# Patient Record
Sex: Male | Born: 1935 | Race: Black or African American | Hispanic: No | Smoking: Never smoker
Health system: Southern US, Community
[De-identification: ages and names within clinical notes are randomized; demographics above are authoritative.]

## PROBLEM LIST (undated history)

## (undated) ENCOUNTER — Emergency Department: Discharge status: Absent without leave | Payer: No Typology Code available for payment source

## (undated) DIAGNOSIS — R531 Weakness: Secondary | ICD-10-CM

## (undated) DIAGNOSIS — R6510 Systemic inflammatory response syndrome (SIRS) of non-infectious origin without acute organ dysfunction: Secondary | ICD-10-CM

## (undated) DIAGNOSIS — R739 Hyperglycemia, unspecified: Secondary | ICD-10-CM

## (undated) DIAGNOSIS — E785 Hyperlipidemia, unspecified: Secondary | ICD-10-CM

## (undated) DIAGNOSIS — E871 Hypo-osmolality and hyponatremia: Secondary | ICD-10-CM

## (undated) DIAGNOSIS — I10 Essential (primary) hypertension: Secondary | ICD-10-CM

## (undated) DIAGNOSIS — F0390 Unspecified dementia without behavioral disturbance: Secondary | ICD-10-CM

## (undated) DIAGNOSIS — E860 Dehydration: Secondary | ICD-10-CM

## (undated) DIAGNOSIS — R627 Adult failure to thrive: Secondary | ICD-10-CM

## (undated) DIAGNOSIS — E538 Deficiency of other specified B group vitamins: Secondary | ICD-10-CM

## (undated) DIAGNOSIS — R569 Unspecified convulsions: Secondary | ICD-10-CM

## (undated) DIAGNOSIS — G9340 Encephalopathy, unspecified: Secondary | ICD-10-CM

## (undated) DIAGNOSIS — C61 Malignant neoplasm of prostate: Secondary | ICD-10-CM

## (undated) DIAGNOSIS — J189 Pneumonia, unspecified organism: Secondary | ICD-10-CM

## (undated) HISTORY — DX: Malignant neoplasm of prostate: C61

## (undated) HISTORY — DX: Unspecified dementia, unspecified severity, without behavioral disturbance, psychotic disturbance, mood disturbance, and anxiety: F03.90

## (undated) HISTORY — DX: Unspecified convulsions: R56.9

## (undated) HISTORY — DX: Dehydration: E86.0

## (undated) HISTORY — DX: Hypo-osmolality and hyponatremia: E87.1

## (undated) HISTORY — DX: Pneumonia, unspecified organism: J18.9

## (undated) HISTORY — DX: Deficiency of other specified B group vitamins: E53.8

## (undated) HISTORY — DX: Weakness: R53.1

## (undated) HISTORY — DX: Hyperlipidemia, unspecified: E78.5

## (undated) HISTORY — DX: Adult failure to thrive: R62.7

## (undated) HISTORY — DX: Systemic inflammatory response syndrome (sirs) of non-infectious origin without acute organ dysfunction: R65.10

## (undated) HISTORY — DX: Essential (primary) hypertension: I10

## (undated) HISTORY — DX: Hyperglycemia, unspecified: R73.9

## (undated) HISTORY — DX: Encephalopathy, unspecified: G93.40

---

## 2000-12-05 ENCOUNTER — Encounter: Payer: Self-pay | Admitting: Neurosurgery

## 2000-12-06 ENCOUNTER — Ambulatory Visit (HOSPITAL_COMMUNITY): Admission: RE | Admit: 2000-12-06 | Discharge: 2000-12-06 | Payer: Self-pay | Admitting: Neurosurgery

## 2000-12-12 ENCOUNTER — Encounter: Payer: Self-pay | Admitting: Neurosurgery

## 2000-12-12 ENCOUNTER — Inpatient Hospital Stay (HOSPITAL_COMMUNITY): Admission: RE | Admit: 2000-12-12 | Discharge: 2000-12-14 | Payer: Self-pay | Admitting: Neurosurgery

## 2003-10-10 ENCOUNTER — Other Ambulatory Visit: Payer: Self-pay

## 2004-09-27 ENCOUNTER — Ambulatory Visit: Payer: Self-pay

## 2006-02-19 ENCOUNTER — Ambulatory Visit: Payer: Self-pay | Admitting: Gastroenterology

## 2007-06-18 ENCOUNTER — Other Ambulatory Visit: Payer: Self-pay

## 2007-06-18 ENCOUNTER — Emergency Department: Payer: Self-pay | Admitting: Internal Medicine

## 2008-01-07 ENCOUNTER — Ambulatory Visit: Payer: Self-pay | Admitting: Family Medicine

## 2008-04-20 ENCOUNTER — Ambulatory Visit: Payer: Self-pay | Admitting: Urology

## 2008-09-28 ENCOUNTER — Other Ambulatory Visit: Payer: Self-pay | Admitting: Internal Medicine

## 2008-10-02 ENCOUNTER — Ambulatory Visit: Payer: Self-pay | Admitting: Internal Medicine

## 2009-01-27 ENCOUNTER — Ambulatory Visit: Payer: Self-pay | Admitting: Gastroenterology

## 2009-02-08 ENCOUNTER — Ambulatory Visit: Payer: Self-pay | Admitting: Gastroenterology

## 2009-03-16 ENCOUNTER — Ambulatory Visit: Payer: Self-pay | Admitting: Family Medicine

## 2009-06-29 ENCOUNTER — Ambulatory Visit: Payer: Self-pay | Admitting: Urology

## 2009-11-08 ENCOUNTER — Ambulatory Visit: Payer: Self-pay | Admitting: Internal Medicine

## 2009-11-23 ENCOUNTER — Ambulatory Visit: Payer: Self-pay | Admitting: Internal Medicine

## 2009-12-09 ENCOUNTER — Ambulatory Visit: Payer: Self-pay | Admitting: Internal Medicine

## 2010-04-07 ENCOUNTER — Emergency Department: Payer: Self-pay | Admitting: Emergency Medicine

## 2010-05-17 ENCOUNTER — Emergency Department: Payer: Self-pay | Admitting: Emergency Medicine

## 2010-12-13 ENCOUNTER — Emergency Department: Payer: Self-pay | Admitting: Emergency Medicine

## 2012-01-15 ENCOUNTER — Observation Stay: Payer: Self-pay | Admitting: Internal Medicine

## 2012-01-15 LAB — COMPREHENSIVE METABOLIC PANEL
Albumin: 3.8 g/dL (ref 3.4–5.0)
Anion Gap: 9 (ref 7–16)
BUN: 12 mg/dL (ref 7–18)
Bilirubin,Total: 0.7 mg/dL (ref 0.2–1.0)
Calcium, Total: 9.1 mg/dL (ref 8.5–10.1)
Chloride: 101 mmol/L (ref 98–107)
Creatinine: 0.98 mg/dL (ref 0.60–1.30)
EGFR (African American): >60
Glucose: 156 mg/dL — ABNORMAL HIGH (ref 65–99)
Potassium: 4.1 mmol/L (ref 3.5–5.1)
SGOT(AST): 26 U/L (ref 15–37)
Sodium: 135 mmol/L — ABNORMAL LOW (ref 136–145)
Total Protein: 8.9 g/dL — ABNORMAL HIGH (ref 6.4–8.2)

## 2012-01-15 LAB — URINALYSIS, COMPLETE
Bacteria: NONE SEEN
Bilirubin,UR: NEGATIVE
Glucose,UR: NEGATIVE mg/dL (ref 0–75)
Ketone: NEGATIVE
Leukocyte Esterase: NEGATIVE
Ph: 5 (ref 4.5–8.0)
RBC,UR: 1 /HPF (ref 0–5)
Specific Gravity: 1.018 (ref 1.003–1.030)
Squamous Epithelial: NONE SEEN
WBC UR: 1 /HPF (ref 0–5)

## 2012-01-15 LAB — TROPONIN I
Troponin-I: <0.02 ng/mL
Troponin-I: <0.02 ng/mL

## 2012-01-15 LAB — CBC
HCT: 37.1 % — ABNORMAL LOW (ref 40.0–52.0)
MCHC: 35.1 g/dL (ref 32.0–36.0)
Platelet: 234 10*3/uL (ref 150–440)
RBC: 3.87 10*6/uL — ABNORMAL LOW (ref 4.40–5.90)
RDW: 13.6 % (ref 11.5–14.5)
WBC: 10.7 10*3/uL — ABNORMAL HIGH (ref 3.8–10.6)

## 2012-01-16 LAB — CBC WITH DIFFERENTIAL/PLATELET
Eosinophil #: 0.2 10*3/uL (ref 0.0–0.7)
Eosinophil %: 2.3 %
HCT: 34.6 % — ABNORMAL LOW (ref 40.0–52.0)
HGB: 11.7 g/dL — ABNORMAL LOW (ref 13.0–18.0)
MCH: 32.4 pg (ref 26.0–34.0)
MCHC: 33.7 g/dL (ref 32.0–36.0)
MCV: 96 fL (ref 80–100)
Monocyte #: 1.3 x10 3/mm — ABNORMAL HIGH (ref 0.2–1.0)
Monocyte %: 14.7 %
Neutrophil %: 59.3 %
Platelet: 207 10*3/uL (ref 150–440)
RBC: 3.61 10*6/uL — ABNORMAL LOW (ref 4.40–5.90)
RDW: 13.5 % (ref 11.5–14.5)

## 2012-01-16 LAB — BASIC METABOLIC PANEL
Calcium, Total: 8.7 mg/dL (ref 8.5–10.1)
Chloride: 105 mmol/L (ref 98–107)
Co2: 25 mmol/L (ref 21–32)
Creatinine: 0.88 mg/dL (ref 0.60–1.30)
EGFR (African American): >60
Glucose: 152 mg/dL — ABNORMAL HIGH (ref 65–99)
Potassium: 3.6 mmol/L (ref 3.5–5.1)
Sodium: 138 mmol/L (ref 136–145)

## 2012-01-16 LAB — TROPONIN I
Troponin-I: <0.02 ng/mL
Troponin-I: <0.02 ng/mL

## 2012-01-16 LAB — CK TOTAL AND CKMB (NOT AT ARMC)
CK, Total: 39 U/L (ref 35–232)
CK, Total: 80 U/L (ref 35–232)
CK-MB: 0.7 ng/mL (ref 0.5–3.6)
CK-MB: 1.1 ng/mL (ref 0.5–3.6)

## 2012-03-08 LAB — URINALYSIS, COMPLETE
Bilirubin,UR: NEGATIVE
Leukocyte Esterase: NEGATIVE
Nitrite: NEGATIVE
Ph: 6 (ref 4.5–8.0)
RBC,UR: 2 /HPF (ref 0–5)
Squamous Epithelial: NONE SEEN
WBC UR: <1 /HPF (ref 0–5)

## 2012-03-08 LAB — CBC
HCT: 36.5 % — ABNORMAL LOW (ref 40.0–52.0)
HGB: 13 g/dL (ref 13.0–18.0)
MCHC: 35.7 g/dL (ref 32.0–36.0)
MCV: 95 fL (ref 80–100)
RBC: 3.82 10*6/uL — ABNORMAL LOW (ref 4.40–5.90)
WBC: 10.4 10*3/uL (ref 3.8–10.6)

## 2012-03-08 LAB — COMPREHENSIVE METABOLIC PANEL
Alkaline Phosphatase: 81 U/L (ref 50–136)
BUN: 10 mg/dL (ref 7–18)
Bilirubin,Total: 0.8 mg/dL (ref 0.2–1.0)
Chloride: 99 mmol/L (ref 98–107)
Co2: 24 mmol/L (ref 21–32)
Creatinine: 0.87 mg/dL (ref 0.60–1.30)
EGFR (African American): >60
EGFR (Non-African Amer.): >60
SGOT(AST): 26 U/L (ref 15–37)
SGPT (ALT): 18 U/L (ref 12–78)
Total Protein: 8.8 g/dL — ABNORMAL HIGH (ref 6.4–8.2)

## 2012-03-09 ENCOUNTER — Observation Stay: Payer: Self-pay | Admitting: Internal Medicine

## 2012-03-09 LAB — CBC WITH DIFFERENTIAL/PLATELET
Basophil #: 0.1 10*3/uL (ref 0.0–0.1)
HCT: 35.1 % — ABNORMAL LOW (ref 40.0–52.0)
Lymphocyte %: 28.1 %
MCH: 33.9 pg (ref 26.0–34.0)
Monocyte %: 12 %
Neutrophil %: 56.2 %
Platelet: 220 10*3/uL (ref 150–440)
RDW: 13.1 % (ref 11.5–14.5)
WBC: 8.4 10*3/uL (ref 3.8–10.6)

## 2012-03-09 LAB — BASIC METABOLIC PANEL
BUN: 10 mg/dL (ref 7–18)
Calcium, Total: 9.1 mg/dL (ref 8.5–10.1)
Chloride: 101 mmol/L (ref 98–107)
EGFR (African American): >60
EGFR (Non-African Amer.): >60
Glucose: 120 mg/dL — ABNORMAL HIGH (ref 65–99)
Potassium: 3.6 mmol/L (ref 3.5–5.1)
Sodium: 133 mmol/L — ABNORMAL LOW (ref 136–145)

## 2012-03-09 LAB — TSH: Thyroid Stimulating Horm: 2.75 u[IU]/mL

## 2012-03-10 LAB — IRON AND TIBC
Iron Bind.Cap.(Total): 165 ug/dL — ABNORMAL LOW (ref 250–450)
Iron: 55 ug/dL — ABNORMAL LOW (ref 65–175)
Unbound Iron-Bind.Cap.: 110 ug/dL

## 2012-03-10 LAB — CBC WITH DIFFERENTIAL/PLATELET
Basophil #: 0.1 10*3/uL (ref 0.0–0.1)
Basophil %: 1.1 %
Eosinophil %: 5.6 %
HCT: 31 % — ABNORMAL LOW (ref 40.0–52.0)
Lymphocyte #: 1.7 10*3/uL (ref 1.0–3.6)
Lymphocyte %: 29.6 %
MCH: 33 pg (ref 26.0–34.0)
MCV: 95 fL (ref 80–100)
Monocyte %: 10.8 %
Neutrophil #: 3 10*3/uL (ref 1.4–6.5)
RBC: 3.25 10*6/uL — ABNORMAL LOW (ref 4.40–5.90)
RDW: 13.4 % (ref 11.5–14.5)
WBC: 5.7 10*3/uL (ref 3.8–10.6)

## 2012-03-10 LAB — URINE CULTURE

## 2012-03-10 LAB — BASIC METABOLIC PANEL
Calcium, Total: 8.1 mg/dL — ABNORMAL LOW (ref 8.5–10.1)
Chloride: 107 mmol/L (ref 98–107)
EGFR (Non-African Amer.): >60
Glucose: 129 mg/dL — ABNORMAL HIGH (ref 65–99)
Osmolality: 275 (ref 275–301)
Potassium: 3.7 mmol/L (ref 3.5–5.1)

## 2012-03-10 LAB — HEMOGLOBIN A1C: Hemoglobin A1C: 6.6 % — ABNORMAL HIGH (ref 4.2–6.3)

## 2012-03-10 LAB — FERRITIN: Ferritin (ARMC): 272 ng/mL (ref 8–388)

## 2012-03-12 ENCOUNTER — Encounter: Payer: Self-pay | Admitting: Internal Medicine

## 2012-05-27 ENCOUNTER — Ambulatory Visit: Payer: Self-pay | Admitting: Family Medicine

## 2012-08-19 ENCOUNTER — Ambulatory Visit: Payer: Self-pay | Admitting: Family Medicine

## 2012-10-16 ENCOUNTER — Inpatient Hospital Stay: Payer: Self-pay | Admitting: Internal Medicine

## 2012-10-16 LAB — COMPREHENSIVE METABOLIC PANEL
Albumin: 3 g/dL — ABNORMAL LOW (ref 3.4–5.0)
Alkaline Phosphatase: 92 U/L (ref 50–136)
Anion Gap: 5 — ABNORMAL LOW (ref 7–16)
BUN: 12 mg/dL (ref 7–18)
Bilirubin,Total: 0.7 mg/dL (ref 0.2–1.0)
Calcium, Total: 8.8 mg/dL (ref 8.5–10.1)
Chloride: 99 mmol/L (ref 98–107)
Co2: 24 mmol/L (ref 21–32)
Creatinine: 1.04 mg/dL (ref 0.60–1.30)
EGFR (African American): >60
EGFR (Non-African Amer.): >60
Glucose: 195 mg/dL — ABNORMAL HIGH (ref 65–99)
Osmolality: 262 (ref 275–301)
Potassium: 3.9 mmol/L (ref 3.5–5.1)
SGOT(AST): 34 U/L (ref 15–37)
Sodium: 128 mmol/L — ABNORMAL LOW (ref 136–145)
Total Protein: 8 g/dL (ref 6.4–8.2)

## 2012-10-16 LAB — CBC
HCT: 38 % — ABNORMAL LOW (ref 40.0–52.0)
MCH: 32.4 pg (ref 26.0–34.0)
MCHC: 34.1 g/dL (ref 32.0–36.0)
MCV: 95 fL (ref 80–100)
Platelet: 240 10*3/uL (ref 150–440)
RBC: 4 10*6/uL — ABNORMAL LOW (ref 4.40–5.90)
RDW: 13.5 % (ref 11.5–14.5)

## 2012-10-16 LAB — URINALYSIS, COMPLETE
Glucose,UR: 50 mg/dL (ref 0–75)
Leukocyte Esterase: NEGATIVE
Nitrite: NEGATIVE
RBC,UR: <1 /HPF (ref 0–5)
Specific Gravity: 1.015 (ref 1.003–1.030)
WBC UR: <1 /HPF (ref 0–5)

## 2012-10-16 LAB — CK TOTAL AND CKMB (NOT AT ARMC): CK-MB: 0.5 ng/mL (ref 0.5–3.6)

## 2012-10-17 LAB — BASIC METABOLIC PANEL
Anion Gap: 5 — ABNORMAL LOW (ref 7–16)
Co2: 27 mmol/L (ref 21–32)
Creatinine: 1.16 mg/dL (ref 0.60–1.30)
EGFR (African American): >60
EGFR (Non-African Amer.): 56 — ABNORMAL LOW
Glucose: 139 mg/dL — ABNORMAL HIGH (ref 65–99)
Potassium: 3.8 mmol/L (ref 3.5–5.1)
Sodium: 136 mmol/L (ref 136–145)

## 2012-10-18 LAB — CBC WITH DIFFERENTIAL/PLATELET
Basophil #: 0.1 10*3/uL (ref 0.0–0.1)
Eosinophil #: 0.1 10*3/uL (ref 0.0–0.7)
Eosinophil %: 0.6 %
HCT: 35 % — ABNORMAL LOW (ref 40.0–52.0)
HGB: 12.2 g/dL — ABNORMAL LOW (ref 13.0–18.0)
Lymphocyte #: 1.5 10*3/uL (ref 1.0–3.6)
Lymphocyte %: 15.7 %
MCH: 32.8 pg (ref 26.0–34.0)
MCHC: 34.8 g/dL (ref 32.0–36.0)
MCV: 94 fL (ref 80–100)
Monocyte #: 1.3 x10 3/mm — ABNORMAL HIGH (ref 0.2–1.0)
Neutrophil #: 6.5 10*3/uL (ref 1.4–6.5)
Neutrophil %: 69.5 %
RBC: 3.71 10*6/uL — ABNORMAL LOW (ref 4.40–5.90)
RDW: 13.4 % (ref 11.5–14.5)

## 2012-10-22 LAB — PLATELET COUNT: Platelet: 285 10*3/uL (ref 150–440)

## 2012-10-23 ENCOUNTER — Observation Stay: Payer: Self-pay | Admitting: Internal Medicine

## 2012-10-23 LAB — TROPONIN I
Troponin-I: <0.02 ng/mL
Troponin-I: <0.02 ng/mL

## 2012-10-23 LAB — URINALYSIS, COMPLETE
Bacteria: NONE SEEN
Bilirubin,UR: NEGATIVE
Glucose,UR: NEGATIVE mg/dL (ref 0–75)
Leukocyte Esterase: NEGATIVE
Nitrite: NEGATIVE
Ph: 7 (ref 4.5–8.0)
Protein: 30
RBC,UR: NONE SEEN /HPF (ref 0–5)
WBC UR: <1 /HPF (ref 0–5)

## 2012-10-23 LAB — COMPREHENSIVE METABOLIC PANEL
Anion Gap: 6 — ABNORMAL LOW (ref 7–16)
Bilirubin,Total: 0.3 mg/dL (ref 0.2–1.0)
Calcium, Total: 8.8 mg/dL (ref 8.5–10.1)
Co2: 28 mmol/L (ref 21–32)
Creatinine: 1.21 mg/dL (ref 0.60–1.30)
Glucose: 136 mg/dL — ABNORMAL HIGH (ref 65–99)
SGPT (ALT): 124 U/L — ABNORMAL HIGH (ref 12–78)
Total Protein: 7.5 g/dL (ref 6.4–8.2)

## 2012-10-23 LAB — CK TOTAL AND CKMB (NOT AT ARMC)
CK, Total: 68 U/L (ref 35–232)
CK, Total: 63 U/L (ref 35–232)
CK-MB: 1.2 ng/mL (ref 0.5–3.6)

## 2012-10-23 LAB — CBC
MCHC: 34.4 g/dL (ref 32.0–36.0)
RBC: 3.9 10*6/uL — ABNORMAL LOW (ref 4.40–5.90)
RDW: 13.6 % (ref 11.5–14.5)

## 2012-10-24 DIAGNOSIS — I359 Nonrheumatic aortic valve disorder, unspecified: Secondary | ICD-10-CM

## 2012-10-24 LAB — TROPONIN I
Troponin-I: <0.02 ng/mL
Troponin-I: <0.02 ng/mL

## 2012-10-24 LAB — BASIC METABOLIC PANEL
Anion Gap: 8 (ref 7–16)
BUN: 11 mg/dL (ref 7–18)
Chloride: 104 mmol/L (ref 98–107)
Co2: 24 mmol/L (ref 21–32)
Creatinine: 1.02 mg/dL (ref 0.60–1.30)
EGFR (Non-African Amer.): >60
Glucose: 123 mg/dL — ABNORMAL HIGH (ref 65–99)
Sodium: 136 mmol/L (ref 136–145)

## 2012-10-24 LAB — CK TOTAL AND CKMB (NOT AT ARMC)
CK, Total: 97 U/L (ref 35–232)
CK-MB: 0.7 ng/mL (ref 0.5–3.6)

## 2012-10-26 ENCOUNTER — Encounter: Payer: Self-pay | Admitting: Internal Medicine

## 2012-11-08 ENCOUNTER — Encounter: Payer: Self-pay | Admitting: Internal Medicine

## 2012-11-12 ENCOUNTER — Ambulatory Visit: Payer: Self-pay | Admitting: Gerontology

## 2013-04-14 ENCOUNTER — Inpatient Hospital Stay: Payer: Self-pay | Admitting: Internal Medicine

## 2013-04-14 LAB — COMPREHENSIVE METABOLIC PANEL
Albumin: 3.1 g/dL — ABNORMAL LOW (ref 3.4–5.0)
Alkaline Phosphatase: 92 U/L
Anion Gap: 5 — ABNORMAL LOW (ref 7–16)
BILIRUBIN TOTAL: 1 mg/dL (ref 0.2–1.0)
BUN: 12 mg/dL (ref 7–18)
CALCIUM: 9.4 mg/dL (ref 8.5–10.1)
CHLORIDE: 97 mmol/L — AB (ref 98–107)
CO2: 28 mmol/L (ref 21–32)
CREATININE: 1.05 mg/dL (ref 0.60–1.30)
EGFR (African American): >60
Glucose: 161 mg/dL — ABNORMAL HIGH (ref 65–99)
OSMOLALITY: 264 (ref 275–301)
POTASSIUM: 4.2 mmol/L (ref 3.5–5.1)
SGOT(AST): 20 U/L (ref 15–37)
SGPT (ALT): 18 U/L (ref 12–78)
SODIUM: 130 mmol/L — AB (ref 136–145)
Total Protein: 9.1 g/dL — ABNORMAL HIGH (ref 6.4–8.2)

## 2013-04-14 LAB — CBC
HCT: 43.9 % (ref 40.0–52.0)
HGB: 14.7 g/dL (ref 13.0–18.0)
MCH: 31.8 pg (ref 26.0–34.0)
MCHC: 33.5 g/dL (ref 32.0–36.0)
MCV: 95 fL (ref 80–100)
Platelet: 314 10*3/uL (ref 150–440)
RBC: 4.63 10*6/uL (ref 4.40–5.90)
RDW: 13.4 % (ref 11.5–14.5)
WBC: 12.8 10*3/uL — AB (ref 3.8–10.6)

## 2013-04-14 LAB — AMMONIA: Ammonia, Plasma: <10 mcmol/L (ref 11–32)

## 2013-04-14 LAB — TROPONIN I: Troponin-I: <0.02 ng/mL

## 2013-04-14 LAB — TSH: Thyroid Stimulating Horm: 2.59 u[IU]/mL

## 2013-04-15 LAB — BASIC METABOLIC PANEL
ANION GAP: 6 — AB (ref 7–16)
BUN: 11 mg/dL (ref 7–18)
CO2: 23 mmol/L (ref 21–32)
CREATININE: 0.88 mg/dL (ref 0.60–1.30)
Calcium, Total: 8.9 mg/dL (ref 8.5–10.1)
Chloride: 101 mmol/L (ref 98–107)
EGFR (African American): >60
Glucose: 130 mg/dL — ABNORMAL HIGH (ref 65–99)
OSMOLALITY: 262 (ref 275–301)
POTASSIUM: 4.1 mmol/L (ref 3.5–5.1)
SODIUM: 130 mmol/L — AB (ref 136–145)

## 2013-04-15 LAB — CBC WITH DIFFERENTIAL/PLATELET
BASOS ABS: 0 10*3/uL (ref 0.0–0.1)
Basophil %: 0.5 %
EOS ABS: 0.2 10*3/uL (ref 0.0–0.7)
EOS PCT: 1.7 %
HCT: 39.1 % — AB (ref 40.0–52.0)
HGB: 13.2 g/dL (ref 13.0–18.0)
LYMPHS PCT: 14 %
Lymphocyte #: 1.3 10*3/uL (ref 1.0–3.6)
MCH: 32 pg (ref 26.0–34.0)
MCHC: 33.7 g/dL (ref 32.0–36.0)
MCV: 95 fL (ref 80–100)
Monocyte #: 0.9 x10 3/mm (ref 0.2–1.0)
Monocyte %: 10.2 %
NEUTROS ABS: 6.8 10*3/uL — AB (ref 1.4–6.5)
NEUTROS PCT: 73.6 %
PLATELETS: 290 10*3/uL (ref 150–440)
RBC: 4.13 10*6/uL — ABNORMAL LOW (ref 4.40–5.90)
RDW: 13.3 % (ref 11.5–14.5)
WBC: 9.2 10*3/uL (ref 3.8–10.6)

## 2013-04-17 LAB — BASIC METABOLIC PANEL
Anion Gap: 6 — ABNORMAL LOW (ref 7–16)
BUN: 9 mg/dL (ref 7–18)
Calcium, Total: 8.6 mg/dL (ref 8.5–10.1)
Chloride: 102 mmol/L (ref 98–107)
Co2: 26 mmol/L (ref 21–32)
Creatinine: 0.97 mg/dL (ref 0.60–1.30)
EGFR (African American): >60
GLUCOSE: 134 mg/dL — AB (ref 65–99)
OSMOLALITY: 269 (ref 275–301)
Potassium: 4.6 mmol/L (ref 3.5–5.1)
Sodium: 134 mmol/L — ABNORMAL LOW (ref 136–145)

## 2013-04-22 ENCOUNTER — Encounter: Payer: Self-pay | Admitting: Adult Health

## 2013-04-22 ENCOUNTER — Non-Acute Institutional Stay (SKILLED_NURSING_FACILITY): Payer: Medicare Other | Admitting: Adult Health

## 2013-04-22 DIAGNOSIS — G9340 Encephalopathy, unspecified: Secondary | ICD-10-CM

## 2013-04-22 DIAGNOSIS — I10 Essential (primary) hypertension: Secondary | ICD-10-CM

## 2013-04-22 DIAGNOSIS — R5381 Other malaise: Secondary | ICD-10-CM

## 2013-04-22 DIAGNOSIS — N400 Enlarged prostate without lower urinary tract symptoms: Secondary | ICD-10-CM

## 2013-04-22 DIAGNOSIS — R531 Weakness: Secondary | ICD-10-CM | POA: Insufficient documentation

## 2013-04-22 DIAGNOSIS — F0390 Unspecified dementia without behavioral disturbance: Secondary | ICD-10-CM

## 2013-04-22 DIAGNOSIS — R5383 Other fatigue: Secondary | ICD-10-CM

## 2013-04-22 NOTE — Progress Notes (Signed)
Patient ID: [name], male   DOB: [birth year], 80 y.o.   MRN: [id number]     [facility]  Allergies  Allergen Reactions  . Penicillins      Chief Complaint  Patient presents with  . Hospitalization Follow-up    HPI:  He had recently had pneumonia went home; then had to return back to the hospital for increased weakness and acute encephalopathy. He is here for short term rehab. He lives in his own home; but has 24 hour care in his home environment.     Past Medical History  Diagnosis Date  . Hyperlipidemia   . Hypertension   . Seizures   . Prostate cancer   . Acute encephalopathy   . Dementia   . SIRS (systemic inflammatory response syndrome)   . Pneumonia   . Weakness generalized   . Dehydration   . B12 deficiency   . Hyponatremia   . Hyperglycemia   . FTT (failure to thrive) in adult     No past surgical history on file.  VITAL SIGNS BP 143/70  Pulse 80  Ht 5\' 8"  (1.727 m)  Wt 157 lb (71.215 kg)  BMI 23.88 kg/m2   Patient's Medications  New Prescriptions   No medications on file  Previous Medications   ASPIRIN 81 MG TABLET    Take 81 mg by mouth daily.   FINASTERIDE (PROSCAR) 5 MG TABLET    Take 5 mg by mouth daily.   ISOSORBIDE MONONITRATE (IMDUR) 60 MG 24 HR TABLET    Take 60 mg by mouth daily.   MEMANTINE HCL ER (NAMENDA XR) 28 MG CP24    Take 28 mg by mouth daily.  Modified Medications   No medications on file  Discontinued Medications   No medications on file    SIGNIFICANT DIAGNOSTIC EXAMS  04-14-13: chest x-ray: significantly; rotated chronic changes. No clear infiltrate. Elevated left hemidiaphragm cardiomegaly  04-14-13: ekg: ST with occasional pac's  04-14-13: ct of head: atrophy and chronic ischemic white matter disease. Small right lacunar infarct. No acute intracranial abnormality.   04-14-13: right hip x-ray: no acute osseous injury of pelvis.      LABS REVIEWED:  04-14-13: wbc 12.8; hgb 14.7; plt 314; glucose 161; bun 12; creat  1.05; k+4.2; na++130; liver normal    Review of Systems  Constitutional: Negative for malaise/fatigue.  Respiratory: Negative for cough and shortness of breath.   Cardiovascular: Negative for chest pain and palpitations.  Gastrointestinal: Negative for heartburn, abdominal pain and constipation.  Musculoskeletal: Negative for back pain, joint pain and myalgias.  Skin: Negative.   Neurological: Negative for headaches.  Psychiatric/Behavioral: Negative for depression. The patient is not nervous/anxious.     Physical Exam  Constitutional: He appears well-developed and well-nourished. No distress.  Neck: Neck supple. No JVD present.  Cardiovascular: Normal rate, regular rhythm and intact distal pulses.   Respiratory: Effort normal and breath sounds normal. No respiratory distress.  GI: Soft. Bowel sounds are normal. He exhibits no distension. There is no tenderness.  Musculoskeletal: Normal range of motion. He exhibits no edema.  Neurological: He is alert.  Skin: Skin is warm and dry. He is not diaphoretic.  Psychiatric: He has a normal mood and affect.      ASSESSMENT/ PLAN:  1. Hypertension; he is stable will continue imdur 60 mg daily and asa 81 mg daily   2. bph with history of prostate cancer: will continue proscar 5 mg daily   3. Dementia without behavioral issues:  is presently with change in status; will continue namenda xr 28 mg dialy  4. Acute encephalopathy and generalized; is stable will continue therapy as directed and will continue to monitor his status.   Will check cbc and bmp in one week   Time spent with patient 50 minutes.

## 2013-04-24 ENCOUNTER — Non-Acute Institutional Stay (SKILLED_NURSING_FACILITY): Payer: Medicare Other | Admitting: Internal Medicine

## 2013-04-24 ENCOUNTER — Encounter: Payer: Self-pay | Admitting: Internal Medicine

## 2013-04-24 DIAGNOSIS — N400 Enlarged prostate without lower urinary tract symptoms: Secondary | ICD-10-CM

## 2013-04-24 DIAGNOSIS — R531 Weakness: Secondary | ICD-10-CM

## 2013-04-24 DIAGNOSIS — R5383 Other fatigue: Secondary | ICD-10-CM

## 2013-04-24 DIAGNOSIS — I10 Essential (primary) hypertension: Secondary | ICD-10-CM

## 2013-04-24 DIAGNOSIS — F0390 Unspecified dementia without behavioral disturbance: Secondary | ICD-10-CM

## 2013-04-24 DIAGNOSIS — R5381 Other malaise: Secondary | ICD-10-CM

## 2013-04-24 NOTE — Progress Notes (Signed)
Patient ID: [name], male   DOB: [birth year], 80 y.o.   MRN: [id number]    [facility]    PCP: No primary provider on file.  Code Status:   Allergies  Allergen Reactions  . Penicillins     Chief Complaint: new admit  HPI:  80 y/o male patient is here for STR after hospital admission with pneumonia and weakness. He also had confusion and acute encephalopathy in setting of this. Goal is to strngthen him and return home where he has 24 hour care provided. He is seen in his room sitting on his wheelchair. He is in no distress and denies any complaints. His oral intake is low as per staff. He has dementia at baseline. No other complaints. He had a mechanical fall 2 days back. No injury reported  Review of Systems  Constitutional: Negative for fever, chills, weight loss, malaise/fatigue and diaphoresis.  HENT: Negative for congestion, hearing loss and sore throat.   Eyes: Negative for blurred vision, double vision and discharge.  Respiratory: Negative for cough, sputum production, shortness of breath and wheezing.   Cardiovascular: Negative for chest pain, palpitations, orthopnea and leg swelling.  Gastrointestinal: Negative for heartburn, nausea, vomiting, abdominal pain, diarrhea and constipation.  Genitourinary: Negative for dysuria, urgency, frequency and flank pain.  Musculoskeletal: Negative for back pain, falls, joint pain and myalgias.  Skin: Negative for itching and rash.  Neurological: Negative for dizziness, tingling, focal weakness and headaches.  Psychiatric/Behavioral: Negative for depression and memory loss. The patient is not nervous/anxious.     Past Medical History  Diagnosis Date  . Hyperlipidemia   . Hypertension   . Seizures   . Prostate cancer   . Acute encephalopathy   . Dementia   . SIRS (systemic inflammatory response syndrome)   . Pneumonia   . Weakness generalized   . Dehydration   . B12 deficiency   . Hyponatremia   . Hyperglycemia   .  FTT (failure to thrive) in adult    History reviewed. No pertinent past surgical history. Social History:   has no tobacco, alcohol, and drug history on file.  No family history on file.  Medications: Patient's Medications  New Prescriptions   No medications on file  Previous Medications   ASPIRIN 81 MG TABLET    Take 81 mg by mouth daily.   FINASTERIDE (PROSCAR) 5 MG TABLET    Take 5 mg by mouth daily.   ISOSORBIDE MONONITRATE (IMDUR) 60 MG 24 HR TABLET    Take 60 mg by mouth daily.   MEMANTINE HCL ER (NAMENDA XR) 28 MG CP24    Take 28 mg by mouth daily.  Modified Medications   No medications on file  Discontinued Medications   No medications on file     Physical Exam: Filed Vitals:   04/24/13 1754  BP: 140/70  Pulse: 79  Temp: 97.7 F (36.5 C)  Resp: 18  SpO2: 96%   Constitutional: He appears well-developed and well-nourished. No distress.  Neck: Neck supple. No JVD present.  Cardiovascular: Normal rate, regular rhythm and intact distal pulses.   Respiratory: Effort normal and breath sounds normal. No respiratory distress.  GI: Soft. Bowel sounds are normal. He exhibits no distension. There is no tenderness.  Musculoskeletal: Normal range of motion. He exhibits no edema.  Neurological: He is alert.  Skin: Skin is warm and dry. He is not diaphoretic.  Psychiatric: He has a normal mood and affect.    Labs reviewed:  04-14-13: wbc 12.8; hgb 14.7; plt 314; glucose 161; bun 12; creat 1.05; k+4.2; na++130; liver normal    Radiological Exams: 04-14-13: chest x-ray: significantly; rotated chronic changes. No clear infiltrate. Elevated left hemidiaphragm cardiomegaly  04-14-13: ekg: ST with occasional pac's  04-14-13: ct of head: atrophy and chronic ischemic white matter disease. Small right lacunar infarct. No acute intracranial abnormality.   04-14-13: right hip x-ray: no acute osseous injury of pelvis.     Assessment/Plan  Generalized weakness- to work with PT and OT,  gait training, muscle strengthening. Encourage po inake. Fall precautions. Has made improvement with therapy team  Hypertension- well controlled. continue imdur 60 mg daily and asa 81 mg daily   Dementia- with no acute behavior changes. Continue namenda xr 20 mg daily for now. Monitor clinically  BPH- continue proscar 5 mg daily for now  Family/ staff Communication: reviewed care plan with patient and nursing supervisor   Goals of care: to return home   Labs/tests ordered: none

## 2013-05-20 ENCOUNTER — Encounter: Payer: Self-pay | Admitting: Adult Health

## 2013-05-20 ENCOUNTER — Non-Acute Institutional Stay (SKILLED_NURSING_FACILITY): Payer: Medicare Other | Admitting: Adult Health

## 2013-05-20 DIAGNOSIS — M549 Dorsalgia, unspecified: Secondary | ICD-10-CM

## 2013-05-20 MED ORDER — ACETAMINOPHEN 500 MG PO TABS: 1000.0000 mg | ORAL_TABLET | Freq: Three times a day (TID) | ORAL | Status: DC

## 2013-05-20 NOTE — Progress Notes (Signed)
Patient ID: [name], male   DOB: 12/26/1920, 80 y.o.   MRN: [id number]    [facility]  Allergies  Allergen Reactions  . Penicillins     Chief Complaint  Patient presents with  . Acute Visit    back pain     HPI:  Staff reports that he is having back and has utilized his standing order for tylenol several times since admission. He tells me that his back hurts from time to time. And states that his pain is all over his back.   Past Medical History  Diagnosis Date  . Hyperlipidemia   . Hypertension   . Seizures   . Prostate cancer   . Acute encephalopathy   . Dementia   . SIRS (systemic inflammatory response syndrome)   . Pneumonia   . Weakness generalized   . Dehydration   . B12 deficiency   . Hyponatremia   . Hyperglycemia   . FTT (failure to thrive) in adult     No past surgical history on file.  VITAL SIGNS BP 100/54  Pulse 68  Ht 5\' 8"  (1.727 m)  Wt 158 lb 12.8 oz (72.031 kg)  BMI 24.15 kg/m2   Patient's Medications  New Prescriptions   No medications on file  Previous Medications   ASPIRIN 81 MG TABLET    Take 81 mg by mouth daily.   FINASTERIDE (PROSCAR) 5 MG TABLET    Take 5 mg by mouth daily.   ISOSORBIDE MONONITRATE (IMDUR) 60 MG 24 HR TABLET    Take 60 mg by mouth daily.   MEMANTINE HCL ER (NAMENDA XR) 28 MG CP24    Take 28 mg by mouth daily.  Modified Medications   No medications on file  Discontinued Medications   No medications on file    SIGNIFICANT DIAGNOSTIC EXAMS  04-14-13: chest x-ray: significantly; rotated chronic changes. No clear infiltrate. Elevated left hemidiaphragm cardiomegaly  04-14-13: ekg: ST with occasional pac's  04-14-13: ct of head: atrophy and chronic ischemic white matter disease. Small right lacunar infarct. No acute intracranial abnormality.   04-14-13: right hip x-ray: no acute osseous injury of pelvis.      LABS REVIEWED:  04-14-13: wbc 12.8; hgb 14.7; plt 314; glucose 161; bun 12; creat 1.05; k+4.2; na++130;  liver normal    Review of Systems  Constitutional: Negative for malaise/fatigue.  Respiratory: Negative for cough and shortness of breath.   Cardiovascular: Negative for chest pain and palpitations.  Gastrointestinal: Negative for heartburn, abdominal pain and constipation.  Musculoskeletal: Negative for joint pain has back pain  Skin: Negative.   Neurological: Negative for headaches.  Psychiatric/Behavioral: Negative for depression. The patient is not nervous/anxious.     Physical Exam  Constitutional: He appears well-developed and well-nourished. No distress.  Neck: Neck supple. No JVD present.  Cardiovascular: Normal rate, regular rhythm and intact distal pulses.   Respiratory: Effort normal and breath sounds normal. No respiratory distress.  GI: Soft. Bowel sounds are normal. He exhibits no distension. There is no tenderness.  Musculoskeletal: Normal range of motion. He exhibits no edema. no tenderness to back  Neurological: He is alert.  Skin: Skin is warm and dry. He is not diaphoretic.  Psychiatric: He has a normal mood and affect.     ASSESSMENT/ PLAN:  1. Back pain: will begin tylenol 1 gm three times daily and will monitor his status.

## 2013-05-23 ENCOUNTER — Non-Acute Institutional Stay (SKILLED_NURSING_FACILITY): Payer: Medicare Other | Admitting: Adult Health

## 2013-05-23 DIAGNOSIS — R531 Weakness: Secondary | ICD-10-CM

## 2013-05-23 DIAGNOSIS — M549 Dorsalgia, unspecified: Secondary | ICD-10-CM

## 2013-05-23 DIAGNOSIS — R5381 Other malaise: Secondary | ICD-10-CM

## 2013-05-23 DIAGNOSIS — R5383 Other fatigue: Secondary | ICD-10-CM

## 2013-05-23 DIAGNOSIS — I10 Essential (primary) hypertension: Secondary | ICD-10-CM

## 2013-05-23 DIAGNOSIS — F0390 Unspecified dementia without behavioral disturbance: Secondary | ICD-10-CM

## 2013-05-23 DIAGNOSIS — N400 Enlarged prostate without lower urinary tract symptoms: Secondary | ICD-10-CM

## 2013-05-26 ENCOUNTER — Encounter: Payer: Self-pay | Admitting: Adult Health

## 2013-05-26 NOTE — Progress Notes (Signed)
Patient ID: [name], male   DOB: [birth year], 80 y.o.   MRN: [id number]     [facility]  Allergies  Allergen Reactions  . Penicillins      Chief Complaint  Patient presents with  . Medical Managment of Chronic Issues    HPI:  He is being seen for the management of his chronic illnesses. He is not having back pain since starting on routine tylenol. There are no concerns being voiced by the nursing staff at this time. He is not voicing any complaints today.    Past Medical History  Diagnosis Date  . Hyperlipidemia   . Hypertension   . Seizures   . Prostate cancer   . Acute encephalopathy   . Dementia   . SIRS (systemic inflammatory response syndrome)   . Pneumonia   . Weakness generalized   . Dehydration   . B12 deficiency   . Hyponatremia   . Hyperglycemia   . FTT (failure to thrive) in adult     No past surgical history on file.  VITAL SIGNS BP 110/63  Pulse 70  Ht 5\' 8"  (1.727 m)  Wt 158 lb 12.8 oz (72.031 kg)  BMI 24.15 kg/m2   Patient's Medications  New Prescriptions   No medications on file  Previous Medications   ACETAMINOPHEN (TYLENOL) 500 MG TABLET    Take 2 tablets (1,000 mg total) by mouth 3 (three) times daily.   ASPIRIN 81 MG TABLET    Take 81 mg by mouth daily.   FINASTERIDE (PROSCAR) 5 MG TABLET    Take 5 mg by mouth daily.   ISOSORBIDE MONONITRATE (IMDUR) 60 MG 24 HR TABLET    Take 60 mg by mouth daily.   MEMANTINE HCL ER (NAMENDA XR) 28 MG CP24    Take 28 mg by mouth daily.  Modified Medications   No medications on file  Discontinued Medications   No medications on file    SIGNIFICANT DIAGNOSTIC EXAMS  04-14-13: chest x-ray: significantly; rotated chronic changes. No clear infiltrate. Elevated left hemidiaphragm cardiomegaly  04-14-13: ekg: ST with occasional pac's  04-14-13: ct of head: atrophy and chronic ischemic white matter disease. Small right lacunar infarct. No acute intracranial abnormality.   04-14-13: right hip x-ray: no  acute osseous injury of pelvis.      LABS REVIEWED:  04-14-13: wbc 12.8; hgb 14.7; plt 314; glucose 161; bun 12; creat 1.05; k+4.2; na++130; liver normal    Review of Systems  Constitutional: Negative for malaise/fatigue.  Respiratory: Negative for cough and shortness of breath.   Cardiovascular: Negative for chest pain and palpitations.  Gastrointestinal: Negative for heartburn, abdominal pain and constipation.  Musculoskeletal: Negative for joint pain no back pain  Skin: Negative.   Neurological: Negative for headaches.  Psychiatric/Behavioral: Negative for depression. The patient is not nervous/anxious.     Physical Exam  Constitutional: He appears well-developed and well-nourished. No distress.  Neck: Neck supple. No JVD present.  Cardiovascular: Normal rate, regular rhythm and intact distal pulses.   Respiratory: Effort normal and breath sounds normal. No respiratory distress.  GI: Soft. Bowel sounds are normal. He exhibits no distension. There is no tenderness.  Musculoskeletal: Normal range of motion. He exhibits no edema.  Neurological: He is alert.  Skin: Skin is warm and dry. He is not diaphoretic.  Psychiatric: He has a normal mood and affect.      ASSESSMENT/ PLAN:  1. Back pain: no complaints of pain present will continue tylenol 1 gm three  times daily   2. Dementia: without change in status: will continue namenda xr 28 mg daily   3. BPH: will continue proscar 5 mg daily   4. Hypertension: is stable will continue  imdur 60 mg daily and asa 81 mg daily and will monitor   5. Generalized weakness: he will continue therapy as directed; his goal continues to be short term rehab.     Ok Edwards NP [facility]  Contact [phone] Monday through Friday 8am- 5pm  After hours call [phone]

## 2013-06-04 ENCOUNTER — Non-Acute Institutional Stay (SKILLED_NURSING_FACILITY): Payer: Medicare Other | Admitting: Adult Health

## 2013-06-04 DIAGNOSIS — G9340 Encephalopathy, unspecified: Secondary | ICD-10-CM

## 2013-06-04 DIAGNOSIS — R5381 Other malaise: Secondary | ICD-10-CM

## 2013-06-04 DIAGNOSIS — R5383 Other fatigue: Secondary | ICD-10-CM

## 2013-06-04 DIAGNOSIS — I10 Essential (primary) hypertension: Secondary | ICD-10-CM

## 2013-06-04 DIAGNOSIS — F0390 Unspecified dementia without behavioral disturbance: Secondary | ICD-10-CM

## 2013-06-04 DIAGNOSIS — R531 Weakness: Secondary | ICD-10-CM

## 2013-06-10 ENCOUNTER — Encounter: Payer: Self-pay | Admitting: Adult Health

## 2013-06-10 NOTE — Progress Notes (Signed)
Patient ID: [name], male   DOB: 1921-04-03, 80 y.o.   MRN: [id number]     [facility]  Allergies  Allergen Reactions  . Penicillins      Chief Complaint  Patient presents with  . Discharge Note    HPI:   He is ready to be discharged to home with home health for pt/ot/nursing. He will not need dme. He will need prescriptions to be written. He had been admitted to this facility for short term rehab for weakness following a hospitalization for pneumonia.    Past Medical History  Diagnosis Date  . Hyperlipidemia   . Hypertension   . Seizures   . Prostate cancer   . Acute encephalopathy   . Dementia   . SIRS (systemic inflammatory response syndrome)   . Pneumonia   . Weakness generalized   . Dehydration   . B12 deficiency   . Hyponatremia   . Hyperglycemia   . FTT (failure to thrive) in adult     No past surgical history on file.  VITAL SIGNS BP 128/72  Pulse 79  Ht 5\' 8"  (1.727 m)  Wt 158 lb 12.8 oz (72.031 kg)  BMI 24.15 kg/m2   Patient's Medications  New Prescriptions   No medications on file  Previous Medications   ACETAMINOPHEN (TYLENOL) 500 MG TABLET    Take 2 tablets (1,000 mg total) by mouth 3 (three) times daily.   ASPIRIN 81 MG TABLET    Take 81 mg by mouth daily.   FINASTERIDE (PROSCAR) 5 MG TABLET    Take 5 mg by mouth daily.   ISOSORBIDE MONONITRATE (IMDUR) 60 MG 24 HR TABLET    Take 60 mg by mouth daily.   MEMANTINE HCL ER (NAMENDA XR) 28 MG CP24    Take 28 mg by mouth daily.  Modified Medications   No medications on file  Discontinued Medications   No medications on file    SIGNIFICANT DIAGNOSTIC EXAMS   04-14-13: chest x-ray: significantly; rotated chronic changes. No clear infiltrate. Elevated left hemidiaphragm cardiomegaly  04-14-13: ekg: ST with occasional pac's  04-14-13: ct of head: atrophy and chronic ischemic white matter disease. Small right lacunar infarct. No acute intracranial abnormality.   04-14-13: right hip x-ray: no  acute osseous injury of pelvis.      LABS REVIEWED:  04-14-13: wbc 12.8; hgb 14.7; plt 314; glucose 161; bun 12; creat 1.05; k+4.2; na++130; liver normal    Review of Systems  Constitutional: Negative for malaise/fatigue.  Respiratory: Negative for cough and shortness of breath.   Cardiovascular: Negative for chest pain and palpitations.  Gastrointestinal: Negative for heartburn, abdominal pain and constipation.  Musculoskeletal: Negative for joint pain no back pain  Skin: Negative.   Neurological: Negative for headaches.  Psychiatric/Behavioral: Negative for depression. The patient is not nervous/anxious.     Physical Exam  Constitutional: He appears well-developed and well-nourished. No distress.  Neck: Neck supple. No JVD present.  Cardiovascular: Normal rate, regular rhythm and intact distal pulses.   Respiratory: Effort normal and breath sounds normal. No respiratory distress.  GI: Soft. Bowel sounds are normal. He exhibits no distension. There is no tenderness.  Musculoskeletal: Normal range of motion. He exhibits no edema.  Neurological: He is alert.  Skin: Skin is warm and dry. He is not diaphoretic.  Psychiatric: He has a normal mood and affect.      ASSESSMENT/ PLAN:  Will discharge to home with home health for pt/ot/nursing. He will not need dme.  His prescriptions have been written.   Time spent with patient 40 minutes.      Ok Edwards NP [facility]  Contact [phone] Monday through Friday 8am- 5pm  After hours call [phone]

## 2014-05-26 ENCOUNTER — Inpatient Hospital Stay: Payer: Self-pay | Admitting: Internal Medicine

## 2014-05-26 LAB — BASIC METABOLIC PANEL
BUN: 16 mg/dL (ref 4–21)
Creatinine: 1.1 mg/dL (ref 0.6–1.3)
Glucose: 179 mg/dL
Potassium: 4.2 mmol/L (ref 3.4–5.3)
SODIUM: 133 mmol/L — AB (ref 137–147)

## 2014-05-26 LAB — CBC AND DIFFERENTIAL
Hemoglobin: 14.8 g/dL (ref 13.5–17.5)
Platelets: 225 10*3/uL (ref 150–399)
WBC: 14 10^3/mL

## 2014-06-01 ENCOUNTER — Non-Acute Institutional Stay (SKILLED_NURSING_FACILITY): Payer: Medicare Other | Admitting: Internal Medicine

## 2014-06-01 DIAGNOSIS — Z049 Encounter for examination and observation for unspecified reason: Secondary | ICD-10-CM | POA: Diagnosis not present

## 2014-06-01 DIAGNOSIS — F0390 Unspecified dementia without behavioral disturbance: Secondary | ICD-10-CM

## 2014-06-01 DIAGNOSIS — N400 Enlarged prostate without lower urinary tract symptoms: Secondary | ICD-10-CM | POA: Diagnosis not present

## 2014-06-01 DIAGNOSIS — R6889 Other general symptoms and signs: Secondary | ICD-10-CM

## 2014-06-01 DIAGNOSIS — R531 Weakness: Secondary | ICD-10-CM | POA: Diagnosis not present

## 2014-06-01 DIAGNOSIS — N3000 Acute cystitis without hematuria: Secondary | ICD-10-CM | POA: Diagnosis not present

## 2014-06-01 DIAGNOSIS — K5900 Constipation, unspecified: Secondary | ICD-10-CM

## 2014-06-01 DIAGNOSIS — I82401 Acute embolism and thrombosis of unspecified deep veins of right lower extremity: Secondary | ICD-10-CM | POA: Diagnosis not present

## 2014-06-01 DIAGNOSIS — I10 Essential (primary) hypertension: Secondary | ICD-10-CM | POA: Diagnosis not present

## 2014-06-30 ENCOUNTER — Encounter: Payer: Self-pay | Admitting: Registered Nurse

## 2014-06-30 ENCOUNTER — Non-Acute Institutional Stay (SKILLED_NURSING_FACILITY): Payer: Medicare Other | Admitting: Registered Nurse

## 2014-06-30 DIAGNOSIS — R634 Abnormal weight loss: Secondary | ICD-10-CM

## 2014-06-30 DIAGNOSIS — R531 Weakness: Secondary | ICD-10-CM

## 2014-06-30 DIAGNOSIS — E871 Hypo-osmolality and hyponatremia: Secondary | ICD-10-CM | POA: Diagnosis not present

## 2014-06-30 DIAGNOSIS — I82411 Acute embolism and thrombosis of right femoral vein: Secondary | ICD-10-CM

## 2014-06-30 DIAGNOSIS — L89620 Pressure ulcer of left heel, unstageable: Secondary | ICD-10-CM | POA: Diagnosis not present

## 2014-06-30 DIAGNOSIS — N400 Enlarged prostate without lower urinary tract symptoms: Secondary | ICD-10-CM

## 2014-06-30 DIAGNOSIS — F0390 Unspecified dementia without behavioral disturbance: Secondary | ICD-10-CM | POA: Diagnosis not present

## 2014-06-30 DIAGNOSIS — K5900 Constipation, unspecified: Secondary | ICD-10-CM | POA: Diagnosis not present

## 2014-06-30 DIAGNOSIS — I10 Essential (primary) hypertension: Secondary | ICD-10-CM | POA: Diagnosis not present

## 2014-06-30 NOTE — Progress Notes (Signed)
Patient ID: [name], male   DOB: May 16, 1920, 81 y.o.   MRN: [id number]   Place of Service: [facility]  Allergies  Allergen Reactions  . Penicillins     Code Status: Full Code  Goals of Care: Longevity/STR  Chief Complaint  Patient presents with  . Medical Management of Chronic Issues    Dementia, Unstageable PU, BPH, DVT, HTN    HPI 81 y.o. male with PMH of dementia, HTN, borderline DM, BPH, HLD, DVT of right deep femoral vein, sepsis from UTI among others is being seen for a routine visit for management of his chronic issues. Has ~7lbs weight loss over the past 30 days. Unstageable pressure ulcer of left heel is measuring larger than during his admission, but has been stable over the past week. No recent fall, change in behavior, or functional status reported. No concerns from staff. HTN fairly controlled with bp in 130s-140s/70s. BPH stable. No issues with bleeding with xarelto for DVT of right deep femoral vein. Seen in room today. Denies any concerns. Family at bedside.   Review of Systems Constitutional: Negative for fever and chills HENT: Negative for ear pain, congestion, and sore throat Eyes: Negative for eye pain and eye discharge Cardiovascular: Negative for chest pain Respiratory: Negative cough and shortness of breath Gastrointestinal: Negative for nausea and vomiting. Negative for abdominal pain Genitourinary: Negative for  dysuria Musculoskeletal: Negative for back pain and joint pain Neurological: Negative for dizziness and headache. Positive for weakness Skin: Negative for rash and itchiness Psychiatric: Negative for depression  Past Medical History  Diagnosis Date  . Hyperlipidemia   . Hypertension   . Seizures   . Prostate cancer   . Acute encephalopathy   . Dementia   . SIRS (systemic inflammatory response syndrome)   . Pneumonia   . Weakness generalized   . Dehydration   . B12 deficiency   . Hyponatremia   . Hyperglycemia   . FTT  (failure to thrive) in adult    Family Hx: DM2, HTN, CVA  History   Social History  . Marital Status: Married    Spouse Name: N/A  . Number of Children: N/A  . Years of Education: N/A   Social History Main Topics  . Smoking status: Never Smoker   . Smokeless tobacco: Not on file  . Alcohol Use: No  . Drug Use: No  . Sexual Activity: Not on file   Other Topics Concern  . None   Social History Narrative      Medication List       This list is accurate as of: 06/30/14  1:20 PM.  Always use your most recent med list.               acetaminophen 325 MG tablet  Commonly known as:  TYLENOL  Take 650 mg by mouth every 4 (four) hours as needed.     aspirin 81 MG tablet  Take 81 mg by mouth daily.     docusate sodium 100 MG capsule  Commonly known as:  COLACE  Take 100 mg by mouth 2 (two) times daily.     finasteride 5 MG tablet  Commonly known as:  PROSCAR  Take 5 mg by mouth daily.     furosemide 20 MG tablet  Commonly known as:  LASIX  Take 20 mg by mouth daily.     isosorbide mononitrate 60 MG 24 hr tablet  Commonly known as:  IMDUR  Take 60 mg by  mouth daily.     Memantine HCl ER 21 MG Cp24  Take 21 mg by mouth daily.     rivaroxaban 20 MG Tabs tablet  Commonly known as:  XARELTO  Take 20 mg by mouth daily with supper.     tamsulosin 0.4 MG Caps capsule  Commonly known as:  FLOMAX  Take 0.4 mg by mouth daily.        Physical Exam  BP 130/62 mmHg  Pulse 78  Temp(Src) 98 F (36.7 C)  Resp 18  Ht 5\' 8"  (1.727 m)  Wt 162 lb 12.8 oz (73.846 kg)  BMI 24.76 kg/m2  SpO2 97%  Constitutional: frail elderly male in no acute distress. Conversant and pleasant with some confusions HEENT: Normocephalic and atraumatic. PERRL. EOM intact. No sleral icterus. ENo nasal discharge or sinus tenderness. Oral mucosa moist. Posterior pharynx clear of any exudate or lesions.  Neck: No lymphadenopathy, masses, or thyromegaly. No JVD or carotid bruits. Cardiac:  Normal S1, S2. RRR without appreciable murmurs, rubs, or gallops. Distal pulses intact. Trace dependent edema.  Lungs: No respiratory distress. Breath sounds clear bilaterally without rales, rhonchi, or wheezes. Abdomen: Audible bowel sounds in all quadrants. Soft, nontender, nondistended.  Musculoskeletal: able to move all extremities with limited ROM throughout and generalized weakness. No joint erythema or tenderness. Skin: Warm and dry. No rash noted. Left heel unstageable pressure ulcer noted, measuring 5x4.5cm with 100% black eschar Neurological: Alert and oriented to self Psychiatric: Appropriate mood and affect for situation   Labs Reviewed  CBC Latest Ref Rng 05/26/2014  WBC - 14.0  Hemoglobin 13.5 - 17.5 g/dL 14.8  Platelets 150 - 399 K/L 225    CMP Latest Ref Rng 05/26/2014  BUN 4 - 21 mg/dL 16  Creatinine 0.6 - 1.3 mg/dL 1.1  Sodium 137 - 147 mmol/L 133(A)  Potassium 3.4 - 5.3 mmol/L 4.2    Assessment & Plan 1. Essential hypertension, benign Stable. Continue lasix 20mg  daily. Not on potassium supplement-will check lab and initiate treatment as indicated  2. Weakness generalized Stable. Continue to work with PT/OT for gait/strength/balance training to restore/maximize function. Fall risk precautions  3. Dementia without behavioral disturbance Stable. Further declined anticipated. Continue namenda xr 21mg  daily. Continue to monitor for change in behavior and assist with ADLs. Fall and pressure ulcer precautions.   4. BPH (benign prostatic hyperplasia) Stable. Continue proscar 5mg  daily and flomax 0.4mg  daily. Continue to monitor  5. Pressure ulcer of left heel, unstageable Stable over the past week. Continue skin prep to wound Qshift. Continue to elevate left heel via boot for pressure relief. Continue to monitor his status.   6. DVT of deep femoral vein, right Stable. Continue xarelto 20mg  daily. Continue to monitor for signs of potential bleeding.   7. Chronic  hyponatremia Last Na 133. No recent lab. Recheck cmp   8. Constipation, unspecified constipation type No issues. Continue colace 100mg  twice daily. Encourage hydration and activities as tolerated.   9. Weight Loss 7lbs over the past 30 days. Continue mechanical soft diet with thin liquids and medpass 172ml twice daily. Continue to monitor.    Diagnostic Studies/Labs Ordered: cbc w/ diff, cmp  Family/Staff Communication Plan of care discussed with family and nursing staff. Family and nursing staff verbalized understanding and agree with plan of care. No additional questions or concerns reported.    Arthur Holms, MSN, AGNP-C [facility] [address] [place], [place] [address] [phone] [8am-5pm] After hours: [phone]

## 2014-07-01 LAB — BASIC METABOLIC PANEL
BUN: 16 mg/dL (ref 4–21)
Creatinine: 0.8 mg/dL (ref 0.6–1.3)
GLUCOSE: 147 mg/dL
POTASSIUM: 4.3 mmol/L (ref 3.4–5.3)
Sodium: 133 mmol/L — AB (ref 137–147)

## 2014-07-01 LAB — CBC AND DIFFERENTIAL
Hemoglobin: 10.2 g/dL — AB (ref 13.5–17.5)
Platelets: 30 10*3/uL — AB (ref 150–399)
WBC: 8.1 10*3/mL

## 2014-07-03 NOTE — Progress Notes (Signed)
Patient ID: [name], male   DOB: [birth year], 81 y.o.   MRN: [id number]    Facility: [facility]   Chief Complaint  Patient presents with  . New Admit To SNF   Allergies  Allergen Reactions  . Penicillins    HPI 81 y/o male patient is here for rehabilitation post hospital admission from 05/26/14-05/28/14 with generalized weakness and difficulty with ambulation. He was diagnosed wth UTI and right deep femoral vein DVT. He was started on xarelto and antibiotics. He has pmh of HTN, borderline DM, hyponatremia, dementia, HLD, seizures and prostate cancer He is seen in his room today. He denies any concerns. He has generalized weakness.   ROS Constitutional: Negative for fever, chills and diaphoresis.  HENT: Negative for congestion.   Eyes: Negative for blurred vision  Respiratory: Negative for cough, shortness of breath and wheezing.   Cardiovascular: Negative for chest pain, palpitations and leg swelling.  Gastrointestinal: Negative for heartburn, nausea, vomiting, abdominal pain.  Genitourinary: Negative for dysuria Musculoskeletal: Negative for back pain, falls  Skin: Negative for itching and rash.  Neurological: Negative for dizziness Psychiatric/Behavioral: has dementia  Past Medical History  Diagnosis Date  . Hyperlipidemia   . Hypertension   . Seizures   . Prostate cancer   . Acute encephalopathy   . Dementia   . SIRS (systemic inflammatory response syndrome)   . Pneumonia   . Weakness generalized   . Dehydration   . B12 deficiency   . Hyponatremia   . Hyperglycemia   . FTT (failure to thrive) in adult    No past surgical history on file.  Medication reviewed. See MAR  No family history on file.   History   Social History  . Marital Status: Married    Spouse Name: N/A  . Number of Children: N/A  . Years of Education: N/A   Occupational History  . Not on file.   Social History Main Topics  . Smoking status: Never Smoker   .  Smokeless tobacco: Not on file  . Alcohol Use: No  . Drug Use: No  . Sexual Activity: Not on file   Other Topics Concern  . Not on file   Social History Narrative   Physical exam BP 128/64 mmHg  Pulse 89  Temp(Src) 98 F (36.7 C)  Resp 18  Constitutional: frail and in no distress.   Neck: Neck supple. No JVD present.   Cardiovascular: Normal rate, regular rhythm and intact distal pulses.    Respiratory: Effort normal and breath sounds normal. No respiratory distress.   GI: Soft. Bowel sounds are normal. He exhibits no distension. There is no tenderness.  Musculoskeletal: generalized weakness with limited range of motion. He exhibits trace edema.  Neurological: He is alert. Oriented to self Skin: Skin is warm and dry. Left heel SDTI Psychiatric: He has a normal mood and affect  Labs- reviewed, see discharge summary  Assessment/plan  Generalized weakness Will have him work with physical therapy and occupational therapy team to help with gait training and muscle strengthening exercises.fall precautions. Skin care. Encourage to be out of bed.   UTI Complete course of ciprofloxacin 500 mg bid, encourage hydration  DVT Stable, continue xarelto for now  Dementia without behavioral disturbance Stable. Monitor po intake and weight, provide assistance with ADLs. continue namenda xr 20 mg daily. Continue pressure ulcer prophylaxis. Continue medpass supplement  SDTI To left heel, continue skin prep and to use prevalon boots  Essential hypertension Stable, continue  isosorbide mononitrate 60 mg daily and lasix 20 mg daily, monitor bmp  BPH Stable, continue finasteride 5 mg daily and flomax  Constipation Continue colace 100mg  twice daily.   Goals of care: short term rehabilitation  [place], MD  [facility] [phone] (Monday-Friday 8 am - 5 pm) [phone] (afterhours)

## 2014-07-07 ENCOUNTER — Non-Acute Institutional Stay (SKILLED_NURSING_FACILITY): Payer: Medicare Other | Admitting: Registered Nurse

## 2014-07-07 ENCOUNTER — Encounter: Payer: Self-pay | Admitting: Registered Nurse

## 2014-07-07 DIAGNOSIS — F0390 Unspecified dementia without behavioral disturbance: Secondary | ICD-10-CM

## 2014-07-07 DIAGNOSIS — L89620 Pressure ulcer of left heel, unstageable: Secondary | ICD-10-CM | POA: Diagnosis not present

## 2014-07-07 DIAGNOSIS — I10 Essential (primary) hypertension: Secondary | ICD-10-CM | POA: Diagnosis not present

## 2014-07-07 DIAGNOSIS — E871 Hypo-osmolality and hyponatremia: Secondary | ICD-10-CM | POA: Diagnosis not present

## 2014-07-07 DIAGNOSIS — N400 Enlarged prostate without lower urinary tract symptoms: Secondary | ICD-10-CM

## 2014-07-07 DIAGNOSIS — R531 Weakness: Secondary | ICD-10-CM | POA: Diagnosis not present

## 2014-07-07 DIAGNOSIS — R634 Abnormal weight loss: Secondary | ICD-10-CM | POA: Diagnosis not present

## 2014-07-07 DIAGNOSIS — D649 Anemia, unspecified: Secondary | ICD-10-CM

## 2014-07-07 DIAGNOSIS — I82411 Acute embolism and thrombosis of right femoral vein: Secondary | ICD-10-CM | POA: Diagnosis not present

## 2014-07-07 DIAGNOSIS — K5900 Constipation, unspecified: Secondary | ICD-10-CM | POA: Diagnosis not present

## 2014-07-07 NOTE — Progress Notes (Signed)
Patient ID: [name], male   DOB: 08-Jul-1920, 81 y.o.   MRN: [id number]   Place of Service: [facility]  Allergies  Allergen Reactions  . Penicillins     Code Status: Full Code  Goals of Care: Longevity/STR  Chief Complaint  Patient presents with  . Discharge Note    HPI 81 y.o. male with PMH of dementia, HTN, borderline DM, BPH, HLD among others is being seen for a discharge visit. He was here for short term rehabilitation post hospital admission from 05/26/14 to 05/28/14 for DVT of right deep femoral pain and UTI. He has worked well with therapy team is ready to be discharged home with [name] PT/OT/RN. Seen in room today. Denies any concerns. Family at bedside  Review of Systems Constitutional: Negative for fever and chills HENT: Negative for ear pain, congestion, and sore throat Eyes: Negative for eye pain and eye discharge Cardiovascular: Negative for chest pain Respiratory: Negative cough and shortness of breath Gastrointestinal: Negative for nausea and vomiting. Negative for abdominal pain Genitourinary: Negative for  dysuria Musculoskeletal: Negative for back pain and joint pain Neurological: Negative for dizziness and headache.  Skin: Negative for rash and itchiness Psychiatric: Negative for depression  Past Medical History  Diagnosis Date  . Hyperlipidemia   . Hypertension   . Seizures   . Prostate cancer   . Acute encephalopathy   . Dementia   . SIRS (systemic inflammatory response syndrome)   . Pneumonia   . Weakness generalized   . Dehydration   . B12 deficiency   . Hyponatremia   . Hyperglycemia   . FTT (failure to thrive) in adult    Family Hx: DM2, HTN, CVA  History   Social History  . Marital Status: Married    Spouse Name: N/A  . Number of Children: N/A  . Years of Education: N/A   Social History Main Topics  . Smoking status: Never Smoker   . Smokeless tobacco: Not on file  . Alcohol Use: No  . Drug Use: No  . Sexual Activity:  Not on file   Other Topics Concern  . None   Social History Narrative      Medication List       This list is accurate as of: 07/07/14  1:19 PM.  Always use your most recent med list.               acetaminophen 325 MG tablet  Commonly known as:  TYLENOL  Take 650 mg by mouth every 4 (four) hours as needed.     aspirin 81 MG tablet  Take 81 mg by mouth daily.     docusate sodium 100 MG capsule  Commonly known as:  COLACE  Take 100 mg by mouth 2 (two) times daily.     finasteride 5 MG tablet  Commonly known as:  PROSCAR  Take 5 mg by mouth daily.     furosemide 20 MG tablet  Commonly known as:  LASIX  Take 20 mg by mouth daily.     isosorbide mononitrate 60 MG 24 hr tablet  Commonly known as:  IMDUR  Take 60 mg by mouth daily.     Memantine HCl ER 21 MG Cp24  Take 21 mg by mouth daily.     rivaroxaban 20 MG Tabs tablet  Commonly known as:  XARELTO  Take 20 mg by mouth daily with supper.     tamsulosin 0.4 MG Caps capsule  Commonly known as:  FLOMAX  Take 0.4 mg by mouth daily.        Physical Exam  BP 94/65 mmHg  Pulse 89  Temp(Src) 97.6 F (36.4 C)  Resp 18  Ht 5\' 8"  (1.727 m)  Wt 162 lb 9.6 oz (73.755 kg)  BMI 24.73 kg/m2  SpO2 97%  Constitutional: frail elderly male in no acute distress. Conversant and pleasantly confused. Able to answer simple questions HEENT: Normocephalic and atraumatic. PERRL. EOM intact. No sleral icterus. No nasal discharge or sinus tenderness. Oral mucosa moist. Posterior pharynx clear of any exudate or lesions.  Neck: No lymphadenopathy, masses, or thyromegaly. No JVD or carotid bruits. Cardiac: Normal S1, S2. RRR without appreciable murmurs, rubs, or gallops. Distal pulses intact. Trace dependent edema.  Lungs: No respiratory distress. Breath sounds clear bilaterally without rales, rhonchi, or wheezes. Abdomen: Audible bowel sounds in all quadrants. Soft, nontender, nondistended.  Musculoskeletal: able to move all  extremities with limited ROM throughout and generalized weakness.  Skin: Warm and dry. No rash noted. Left heel unstageable pressure ulcer noted, measuring 5x4.5cm with 100% black eschar.  Neurological: Alert and oriented to self Psychiatric: Appropriate mood and affect  Labs Reviewed  CBC Latest Ref Rng 07/01/2014 05/26/2014  WBC - 8.1 14.0  Hemoglobin 13.5 - 17.5 g/dL 10.2(A) 14.8  Platelets 150 - 399 K/L 30(A) 225    CMP Latest Ref Rng 07/01/2014 05/26/2014  BUN 4 - 21 mg/dL 16 16  Creatinine 0.6 - 1.3 mg/dL 0.8 1.1  Sodium 137 - 147 mmol/L 133(A) 133(A)  Potassium 3.4 - 5.3 mmol/L 4.3 4.2    Assessment & Plan 1. Essential hypertension, benign Stable. Continue lasix 20mg  daily.   2. Weakness generalized Stable. Continue to work with [facility] PT/OT for gait/strength/balance training to restore/maximize function. Fall risk precautions  3. Dementia without behavioral disturbance Persists. Further declined anticipated. Continue namenda xr 21mg  daily. Fall and pressure ulcer precautions.   4. BPH (benign prostatic hyperplasia) Stable. Continue proscar 5mg  daily and flomax 0.4mg  daily.   5. Pressure ulcer of left heel, unstageable Stable. Continue skin prep. Will be discharged home with [facility] RN for wound management. Encourage elevating left heel via boot for pressure relief. Continue to f/u with pcp  6. DVT of deep femoral vein, right Stable. Continue xarelto 20mg  daily. Continue to f/u with pcp. See #10  7. Chronic hyponatremia Stable. Na 133. PCP to monitor.   8. Constipation, unspecified constipation type Stable. Continue colace 100mg  twice daily. Encourage hydration and activities as tolerated.   9. Weight Loss 7lbs weight loss since admission to facility. Continue mechanical soft diet with thin liquids. Recommended dietary supplement (e.g. Ensure, Boost).  Continue to f/u with PCP  10. Anemia, unspecified.  Hemodynamically stable. MCV 93.2. Recent h&h 10.2/30.3. Hemoccult + x  1. Spoke to son, [name] and he stated that patient does not have any hx of GI bleed. Most likely from [place]. Son also stated that patient will go home with [facility]. Recommended stopping Xarelto as risks outweigh benefits. Son would like to speak to PCP first.   Home health services: PT/OT/RN DME required: None PCP follow-up: Dr. Ancil Boozer on 07/14/14 @ 11am 30-day supply of prescription medications provided.   Family/Staff Communication Plan of care discussed with family and nursing staff. Family and nursing staff verbalized understanding and agree with plan of care. No additional questions or concerns reported.    Arthur Holms, MSN, AGNP-C [facility] [place], [place] [address] [phone] [8am-5pm] After hours: [phone]   

## 2014-07-15 ENCOUNTER — Ambulatory Visit
Admit: 2014-07-15 | Disposition: A | Discharge status: Home / Self Care | Payer: Self-pay | Attending: Family Medicine | Admitting: Family Medicine

## 2014-07-28 NOTE — Discharge Summary (Signed)
PATIENT NAMEASIF, [name] MR#:  [id number] DATE OF BIRTH:  05/27/1920  DATE OF ADMISSION:  03/09/2012 DATE OF DISCHARGE:  03/11/2012  PRIMARY CARE PHYSICIAN: Dr. Steele Moore   DISPOSITION: Patient is going to go to [facility] rehab. We are still waiting for insurance approval. Hopefully discharge will be 03/11/2012 but may end up being 03/12/2012.   NOTE: Patient was admitted 03/08/2012 and discharged hopefully 121/05/2011 pending insurance approval.   FINAL DIAGNOSES:  1. Weakness and dehydration.  2. Hypertension.  3. Hyperglycemia.  4. Hyponatremia.  5. History of prostate cancer.  6. Hyperlipidemia.  7. B12 deficiency.  8. Tooth abscess.   MEDICATIONS ON DISCHARGE:  1. Avodart 0.5 mg 1 capsule daily.  2. Imdur 60 mg 2 tablets daily.  3. Gabapentin 100 mg at bedtime.  4. Protonix 40 mg daily.  5. Coreg 3.125 mg twice a day.  6. Vytorin 10/80, 1 tablet at bedtime.  7. Finasteride 5 mg daily.  8. Aspirin 81 mg daily.  9. Clindamycin 300 mg every eight hours for five more days then stop. 10. Lisinopril 10 mg daily.  11. Vitamin B12 500 mcg orally daily.  12. Stop taking Advil.   DIET: Low sodium diet, carbohydrate controlled diet, mechanical soft.   ACTIVITY: Activity as tolerated.   REFERRAL: Physical therapy. Follow up in 1 to 2 days with doctor at rehab.   REASON FOR ADMISSION: Patient was admitted 03/08/2012, came in with progressive weakness, failure to thrive.   HISTORY OF PRESENT ILLNESS: 81 year old man with history of prostate cancer, hypertension, hyperglycemia, hyperlipidemia presents with 48 hours of progressive weakness total body, could not ambulate, could not even turn over in bed. He has been on clindamycin for a tooth abscess and not eating and drinking very well. He was admitted with progressive weakness, given IV fluid hydration. An MRI of the brain was ordered and neurology consultation was ordered. It does not look like he has been seen by a  neurologist at this point. MRI of the brain was actually canceled because he was feeling better and there were no focal deficits.   LABORATORY, DIAGNOSTIC AND RADIOLOGICAL DATA: Urine culture was negative. Urinalysis showed 1+ blood, glucose 147, BUN 10, creatinine 0.87, sodium 130, potassium 4.0, chloride 99, CO2 24, calcium 9.2. Liver function tests normal range. Total protein slightly high at 8.8, white blood cell count 10.4, hemoglobin and hematocrit 13.0 and 36.5, platelet count 225. EKG showed sinus rhythm, premature supraventricular complex. CT scan of the head: No acute abnormality. Sedimentation rate 71. TSH 2.75, B12 242. Chest x-ray on the 30th showed sub-segmental atelectasis. No focal pneumonia. Ferritin 272, TIBC 165, iron serum 55, hemoglobin A1c 6.6. Sodium after hydration on 12/01 was 137, hemoglobin 10.7, white blood cell count 5.7.   HOSPITAL COURSE PER PROBLEM LIST:  1. For the patient's weakness and dehydration, he was given IV fluid hydration. Patient's weakness had improved. He is ambulating with physical therapy.  Inclined to go to rehab. Patient was accepted by [facility] but we are still waiting for approval from the insurance company. MRI of the brain was canceled since the patient does not have any focal deficits. Aspirin continued.  2. Hypertension. Blood pressure was a little bit elevated. I added lisinopril the patient's Coreg. Patient's blood pressure 158/62. Titration as needed can be done over at rehab.  3. For the patient's hyperglycemia, hemoglobin A1c is 6.6. He can follow up with diabetic diet.  4. Hyponatremia. This improved with IV fluid hydration.  This is believed secondary to dehydration.  5. History of prostate cancer.  6. Hyperlipidemia. He is on Vytorin.  7. B12 deficiency. I will replace this orally.  8. Tooth abscess. Patient has a molar in the upper area that does not look good. He was given clindamycin, will finish up the course. He will need his tooth  extracted at the end of the course with a follow up with the dentist.   TIME SPENT ON DISCHARGE: 35 minutes.   ____________________________ Brad Conch. Leslye Peer, MD rjw:cms D: 03/11/2012 15:04:31 ET T: 03/11/2012 15:25:39 ET JOB#: [id number]  cc: Brad Conch. Leslye Peer, MD, <Dictator> Bethena Roys. Ancil Boozer, [facility] Marisue Brooklyn MD ELECTRONICALLY SIGNED 03/14/2012 17:15

## 2014-07-28 NOTE — H&P (Signed)
PATIENT NAMECASHEL, [name] MR#:  [id number] DATE OF BIRTH:  08-Aug-1920  DATE OF ADMISSION:  01/15/2012  PRIMARY CARE PHYSICIAN: Dr. Ancil Boozer  CARDIOLOGIST: Dr. Clayborn Bigness   HISTORY OF PRESENT ILLNESS: Patient is an 81 year old African American male with past medical history significant for history of coronary artery disease status post cardiac catheterization in 2010 presented to the hospital with complaints of headache. Initially apparently patient presented to the hospital because of headache. Patient's headache is across his forehead. Patient has been having this headache for the past two days day. He was given some Advil with some improvement of his headache, however, that headache recurred. Over the past two days he also noted to have poor appetite as well as significant weakness. He was seen by Dr. Ancil Boozer today and was complaining of chest pains in the left side of the chest, across the chest. Chest pain now is gone, however, usually occurs on exertion and today while he was walking in the Emergency Room he was having tightness on exertion and complained of having chest pain as high as 8/10 by intensity to Emergency Room physician. Today he was not able to walk well, even normally he would walk with a cane, however, he is not able to walk at all because of significant lower extremity weakness. Hospitalist services were contacted for admission.   PAST MEDICAL HISTORY:  1. History of benign prostatic hypertrophy. 2. Prostate cancer. 3. Anemia. 4. Diabetes mellitus type 2. 5. Coronary artery disease. 6. Hypertension. 7. Gastroesophageal reflux disease. 8. Hyperlipidemia. 9. History of cardiac catheterization in the past.   MEDICATIONS:  1. Aspirin 81 mg p.o. daily.  2. Avodart 0.5 mg p.o. daily.  3. Carvedilol 3.125 mg p.o. twice daily. 4. Finasteride 5 mg p.o. daily. 5. Gabapentin 100 mg p.o. at bedtime.  6. Isosorbide mononitrate 120 mg p.o. daily. 7. Pantoprazole 40 mg p.o.  daily. 8. Vytorin 10/80 mg once daily.   SOCIAL HISTORY: Patient is married, has children as well as grandchildren. He is retired. No history smoking or alcohol abuse.   FAMILY HISTORY: Hypertension as well as coronary artery disease.   REVIEW OF SYSTEMS: CONSTITUTIONAL: Positive for fatigue and weakness, pains in the chest as well as headaches, weight loss approximately 6 to 8 pounds in the past few months according to patient's son, however, not significant weight loss. He has somewhat decreased p.o. intake. He has cataracts in his eyes, however, he doe not need operation. He has frequent sinus congestion in his sinuses during this period of time of the year, however, patient denies any significant congestion recently. Denies otherwise problems with upper chest pains on exertion. He admits of having some right-sided weakness as well. He was noted to have elevated blood pressure to 045 systolic here in the Emergency Room. Negative for fevers, weight gain. EYES: In regards to eyes, denies any blurry vision, double vision, glaucoma. ENT: Denies any tinnitus, allergies, epistaxis, sinus pain, dentures, difficulty swallowing. RESPIRATORY: Denies any cough, wheezes, asthma, chronic obstructive pulmonary disease. CARDIOVASCULAR: Denies any orthopnea, edema, arrhythmias, palpitations or syncope. GASTROINTESTINAL: Denies nausea, vomiting, diarrhea, constipation. GENITOURINARY: Denies dysuria, hematuria, frequency, incontinence. ENDOCRINOLOGY: Denies any polydipsia, nocturia, thyroid problems, heat or cold intolerance, or thirst. HEME: Denies any anemia, easy bruising, bleeding, swollen glands. SKIN: Denies any acne, rash, lesions, change in moles. MUSCULOSKELETAL: Denies arthritis, cramps, swelling, gout. NEUROLOGICAL: Denies any numbness, epilepsy, tremor. PSYCH: Denies anxiety, insomnia.   PHYSICAL EXAMINATION:  VITAL SIGNS: On arrival to the hospital patient's vital signs: Temperature  96.5, pulse 71,  respiratory rate 18, blood pressure 168/75, saturation 96% on room air.   GENERAL: This is a well-nourished African American male in no significant distress sitting on the stretcher.  HEENT: His pupils are equal, reactive to light. Extraocular movements are intact. No icterus or conjunctivitis. Has normal hearing. No pharyngeal erythema. Mucosa is moist.   NECK: Neck did not reveal any masses. Supple, nontender. Thyroid not enlarged. No adenopathy. No JVD or carotid bruits bilaterally. Full range of motion.   LUNGS: Somewhat diminished breath sounds bilaterally. Few crackles were heard on the left posteriorly. Better air entrance on the right. Somewhat diminished breath sounds also on the left. No labored inspirations, increased effort, dullness to percussion, overt respiratory distress.   CARDIOVASCULAR: S1, S2 appreciated. No murmurs, gallops or rubs noted. PMI not lateralized. Chest is nontender to palpation. 1+ pedal pulses. No lower extremity edema, calf tenderness or cyanosis was noted.   ABDOMEN: Soft, nontender. Bowel sounds are present. No hepatosplenomegaly or masses were noted.   RECTAL: Deferred.   MUSCULOSKELETAL: Able to move all extremities. No cyanosis, degenerative joint disease. Patient does have significant difficulty sitting up by himself and he needs at least his son's support and help with sitting up. Patient does have kyphosis. Gait is not tested.   SKIN: Skin did not reveal any rashes, lesions, erythema, nodularity, induration. It was warm and dry to palpation.   LYMPH: No adenopathy in cervical region.   NEUROLOGICAL: Cranial nerves grossly intact. Sensory grossly intact. Patient does not have dysarthria.   PSYCH: Patient is alert, oriented to person and place, not to time. He is cooperative, however, memory is significantly impaired. He is intermittently confused.   LABORATORY, DIAGNOSTIC AND RADIOLOGICAL DATA: EKG showed sinus rhythm with premature atrial  complexes 72 beats per minute, normal axis, no acute ST-T changes were noted. Lab data showed glucose 156, sodium 135, otherwise BMP is unremarkable. Patient's total protein elevated to 8.9, otherwise liver enzymes were unremarkable. Cardiac enzymes x2 did not show any significant abnormalities. White blood cell count is elevated to 10.7, hemoglobin 13.0, platelet count 234, absolute neutrophil count is pending. Urinalysis yellow clear urine, negative for glucose, bilirubin or ketones, specific gravity 1.018, pH  5.0, negative for blood, 100 mg/dL protein, negative for nitrites or leukocyte esterase, 1 red blood cell, 1 white blood cell, no bacteria or epithelial cells. Mucous was present. CT scan of head without contrast 01/15/2012 showed paranasal sinus disease with opacification of the right sphenoid sinus, unchanged as compared to prior study and increasing opacification of ethmoid sinuses as well as left maxillary sinus was noted. Stable changes of atrophy with microangiopathy in basal ganglia lacunar infarcts according to radiologist.   ASSESSMENT AND PLAN:  1. Chest pain. Admit patient to medical floor. Continue him on beta blockers as well as aspirin, nitroglycerin as well as heparin and check cardiac enzymes x3. Get Myoview stress test in the morning as well as cardiology consultation.  2. Hypertension. Patient's blood pressure seems to be quite poorly controlled. Will continue outpatient medications and advance them as necessary.  3. Headache, very likely hypertension versus sinusitis related. Will initiate patient on antibiotic therapy with Levaquin and will give him Tylenol as needed.  4. Diabetes mellitus. Will continue outpatient medications as well as sliding scale insulin.  5. History of benign prostatic hypertrophy. Continue outpatient medications.  6. Gastroesophageal reflux disease. Continue PPI.  7. Hyperlipidemia. Check lipid panel and continue Vytorin.   TIME SPENT: 50 minutes.  ____________________________ Theodoro Grist, MD rv:cms D: 01/15/2012 22:32:15 ET T: 01/16/2012 07:09:42 ET JOB#: [id number]  cc: Theodoro Grist, MD, <Dictator> Bethena Roys. Ancil Boozer, MD Theodoro Grist MD ELECTRONICALLY SIGNED 02/23/2012 12:19

## 2014-07-28 NOTE — Discharge Summary (Signed)
PATIENT NAMETAIDEN, [name] MR#:  [id number] DATE OF BIRTH:  [birth year]  DATE OF ADMISSION:  01/15/2012 DATE OF DISCHARGE:  01/16/2012  PRESENTING COMPLAINT: Headache and chest pain.   DISCHARGE DIAGNOSES:  1. Chest pain, appears atypical. Ruled out for myocardial infarction.  2. Sinusitis.  3. Hypertension.  4. Myoview stress test negative.   CONDITION ON DISCHARGE: Stable.   MEDICATIONS:  1. Levaquin 500 mg p.o. daily for seven days for acute sinusitis.  2. Avodart 0.5 mg daily.  3. Isosorbide 60 mg extended-release 2 tablets daily.  4. Gabapentin 100 mg 1 capsule at bedtime.  5. Protonix 40 mg daily.  6. Carvedilol 3.125 p.o. b.i.d.  7. Vytorin 10/80, 1 tablet daily.  8. Finasteride 5 mg daily.  9. Aspirin 81 mg daily.  10. Advil 200 mg, 1 tablet every four hours as needed.  11. Azithromycin 500 mg 4 tablets as needed. Prior to dental appointment .  Follow-up with Dr. Ancil Boozer on 01/30/2012 at 10:15 a.m.   Echo Doppler showed grossly normal LV function, ejection fraction of 55%. No thrombus. Left ventricular systolic function is normal. Normal-sized. Cardiac enzymes x3 negative. Hemoglobin and hematocrit 11.7 and 34.6. Basic metabolic panel within normal limits.   CHEST X-RAY: No acute cardiopulmonary abnormality. CT of the head showed paranasal sinus with opacification of the right sphenoid sinus, unchanged from previous study increasing opacification in ethmoid sinus and left maxillary sinus.   BRIEF SUMMARY OF HOSPITAL COURSE: [name] is an 81 year old African American gentleman who came in with: 1. Chest pain, appears atypical, but has risk factors. His cardiac enzymes remain negative x3, Myoview stress test is normal. Denied any chest pain. No acute EKG changes were noted. He was continued on beta blockers, aspirin and nitroglycerin p.r.n.  2. Hypertension. His Imdur and Coreg were continued. Blood pressure was stable.  3. Headache: Likely due to hypertension versus sinusitis  related. The patient was initiated antibiotic therapy with Levaquin. I did discuss with family members if patient's headache does not improve, will need ENT evaluation of his chronic sinusitis.  4. Type 2 diabetes, his outpatient medications were continued.  5. History of benign prostatic hypertrophy on Avodart.  6. Gastroesophageal reflux disease. Continue PPI.  7. Hyperlipidemia. On Vytorin.  8. Hospital stay otherwise remained stable.   CODE STATUS: THE PATIENT REMAINED A FULL CODE.   The discharge plan was discussed with the patient's son, [name].   Time and 40 minutes.  ____________________________ Hart Rochester Posey Pronto, MD sap:ljs D: 01/18/2012 14:32:00 ET T: 01/19/2012 11:28:15 ET JOB#: [id number]  cc: Shakara Tweedy A. Posey Pronto, MD, <Dictator> Bethena Roys. Ancil Boozer, MD Ilda Basset MD ELECTRONICALLY SIGNED 01/19/2012 21:00

## 2014-07-28 NOTE — H&P (Signed)
PATIENT NAMEJADE, [name] MR#:  [id number] DATE OF BIRTH:  1921-04-06  DATE OF ADMISSION:  03/09/2012  REFERRING PHYSICIAN: Dr. Cinda Quest.   FAMILY PHYSICIAN: Dr. Ancil Boozer.   REASON FOR ADMISSION: Progressive weakness with failure to thrive.   HISTORY OF PRESENT ILLNESS: The patient is an 81 year old male with a history of prostate cancer, hypertension, hyperglycemia, and hyperlipidemia. The patient presents with a 48- hour history of progressive weakness. Got so weak that he was unable to ambulate. Could not even turn over in the bed. He lives with his elderly wife who is unable to care for the patient because of his progressive weakness. He is unable to care for himself. He was brought to the emergency room by his children where he was found to be profoundly weak with a nonfocal neurologic exam. Initial head CT was negative. He is normotensive. He is now admitted for further evaluation.   PAST MEDICAL HISTORY:  1. Prostate cancer.  2. Hyperglycemia/prediabetes.  3. Benign hypertension.  4. Hyperlipidemia.  5. Recent mouth abscess.   MEDICATIONS:  1. Aspirin 81 mg p.o. daily.  2. Avodart 0.5 mg p.o. daily.  3. Proscar 5 mg p.o. daily.  4. Vytorin 10/80 p.o. daily.  5. Protonix 40 mg p.o. daily.  6. Imdur 120 mg p.o. daily.  7. Neurontin 100 mg p.o. at bedtime.  8. Coreg 3.125 mg p.o. b.i.d.   ALLERGIES: Penicillin.   SOCIAL HISTORY: The patient has a remote history of alcohol and tobacco abuse but none recently. Lives with his wife.   FAMILY HISTORY: Positive for diabetes, hypertension, and stroke. Negative for prostate or colon cancer.   REVIEW OF SYSTEMS. CONSTITUTIONAL: No fever or change in weight. EYES: No blurred or double vision. No glaucoma. ENT: No tinnitus or hearing loss. No nasal discharge or bleeding. No difficulty swallowing. RESPIRATORY: No cough or wheezing. Denies hemoptysis. No painful respiration. CARDIOVASCULAR: No chest pain or orthopnea. No palpitations or  syncope. GI: No nausea, vomiting, or diarrhea. No abdominal pain. No change in bowel habits. GU: No dysuria or hematuria. No incontinence. ENDOCRINE: No polyuria or polydipsia. No heat or cold intolerance. HEMATOLOGIC: The patient denies anemia, easy bruising, or bleeding. LYMPHATIC: No swollen glands. MUSCULOSKELETAL: The patient denies pain in his neck, back, shoulders, knees, or hips. No gout. NEUROLOGIC: No numbness. Denies migraines, stroke, or seizures. PSYCH: The patient denies anxiety, insomnia, or depression.   PHYSICAL EXAMINATION:  GENERAL: The patient is elderly, chronically ill-appearing but in no acute distress.   VITAL SIGNS: Vital signs are currently remarkable for a blood pressure of 145/78 with a pulse rate of 76 and a respiratory rate of 18. He is afebrile.   HEENT: Normocephalic, atraumatic. Pupils equally round and reactive to light and accommodation. Extraocular movements are intact. Sclerae nonicteric. Conjunctivae are clear. Oropharynx is clear.   NECK: Supple without jugular venous distention. No adenopathy or thyromegaly is noted.   LUNGS: Clear to auscultation and percussion without wheezes, rales, or rhonchi. No dullness. Respiratory effort is normal.   CARDIAC: Regular rate and rhythm. Normal S1, S2. No significant rubs, murmurs, or gallops. PMI is nondisplaced. Chest wall is nontender.   ABDOMEN: Soft, nontender, with normoactive bowel sounds. No organomegaly or masses were appreciated. No hernias or bruits were noted.   EXTREMITIES: Without clubbing, cyanosis, or edema. Pulses were 1+ bilaterally.   SKIN: Warm and dry without rash or lesions.   NEUROLOGIC: Cranial nerves II through XII grossly intact. Deep tendon reflexes were symmetric. Motor and sensory  exam was grossly nonfocal. Gait was mildly ataxic.   PSYCH: The patient is alert and oriented to person, place, and time. He was cooperative and used good judgment.   LABORATORY DATA: Head CT showed no acute  ischemic changes. No bleed. Urinalysis was unremarkable. White count was 10.4 with a hemoglobin of 13.0. Glucose was 147 with a BUN of 10, creatinine 0.87 with a sodium of 130 and a potassium of 4.0 with a GFR of greater than 60.   ASSESSMENT:  1. Progressive weakness of unclear etiology.  2. Hyperglycemia.  3. Hyponatremia.  4. Ataxia.  5. Prostate cancer.  6. Benign hypertension.  7. Hyperlipidemia.   PLAN: The patient will be observed on the medical floor with IV fluids and neurological checks q.4 hours. We will continue his outpatient regimen at this time including his clindamycin for his mouth abscess. We will follow his sugars with Accu-Cheks before meals and at bedtime and add sliding scale insulin as needed. We will obtain an MRI of the brain because of his ataxia and progressive weakness. We will also consult Neurology for these same reasons. We will obtain a physical therapy consult for ambulation as well as a case manager consult for disposition and discharge planning. Follow up routine labs in the morning. Further treatment and evaluation will depend upon the patient's progress.  TOTAL TIME SPENT ON THIS PATIENT: 45 minutes.  ____________________________ Leonie Douglas Doy Hutching, MD jds:vtd D: 03/08/2012 41:42:39 ET  T: 03/09/2012 08:57:17 ET  JOB#: [id number] cc: Leonie Douglas. Doy Hutching, MD, <Dictator>  Bethena Roys. Ancil Boozer, MD Kyasia Steuck Lennice Sites MD ELECTRONICALLY SIGNED 03/10/2012 13:18

## 2014-07-28 NOTE — Discharge Summary (Signed)
PATIENT NAMEHARLEN, [name] MR#:  [id number] DATE OF BIRTH:  1920-07-25  DATE OF ADMISSION:  03/09/2012 DATE OF DISCHARGE:  03/12/2012  ADDENDUM: Please see discharge summary dictated on 03/11/2012. This is an addendum on 03/12/2012.  HOSPITAL COURSE: From 03/11/2012 onward, the patient is feeling well, offers no complaints, he is feeling stronger and looking forward to going to rehabilitation. He is tolerating a diet. The patient's blood pressure was a little bit elevated. I think he is worried about his wife. Blood pressure this morning is 171/78. His lisinopril was increased to 20 mg instead of 10 mg. The patient was seen before morning medications given. The patient will receive his morning medications and repeat blood pressure will be taken. Slow titration of medications can be done at the rehab.  TIME SPENT ON DISCHARGE: 32 minutes.  ____________________________ Tana Conch. Leslye Peer, MD rjw:slb D: 03/12/2012 09:40:00 ET T: 03/12/2012 09:46:05 ET JOB#: [id number]  cc: Tana Conch. Leslye Peer, MD, <Dictator> Marisue Brooklyn MD ELECTRONICALLY SIGNED 03/14/2012 17:15

## 2014-07-31 NOTE — Discharge Summary (Signed)
PATIENT NAMEEREZ, [name] MR#:  [id number] DATE OF BIRTH:  [birth year]  DATE OF ADMISSION:  10/16/2012 DATE OF DISCHARGE:  10/22/2012  DISPOSITION: To [facility].   CONSULTATIONS: Physical therapy and speech therapy.   DISCHARGE DIAGNOSIS:  1. Systemic inflammatory response syndrome secondary to pneumonia, resolved.  2. Hyponatremia secondary to dehydration, resolved.  3. Somnolence secondary to Ambien and mirtazapine, resolved.  4. Hypertension.  5. Prostate cancer.  6. Deconditioning.   DISCHARGE MEDICATIONS:  1. Avodart 10 mg p.o. daily.  2. Neurontin 100 mg p.o. at bedtime for pain.  3. Finasteride 5 mg p.o. daily.  4. Aspirin 81 mg daily.  5. Imdur 60 mg p.o. daily.  6. Crestor 20 mg p.o. daily.  7. Vitamin B12 at 500 mcg p.o. t.i.d. for 4 days.  8. Nystatin 5 mL every 8 hours for oral thrush as needed.  9. Levaquin 750 mg every 48 hours for 5 doses.  10. Ensure Plus 1 can t.i.d.   DIET: Low sodium, mechanical soft diet with thin liquids with aspiration precautions.   HOSPITAL COURSE: This is an 81 year old male who lives with the family brought in because of weakness, cough, and the patient noted to have fever of 102 Fahrenheit.  The patient brought to the Emergency Room. He was tachycardic at 127 with tachypnea with respirations of 27, temperature 102. Chest x-ray was consistent with left lower lobe pneumonia. The patient was admitted to the hospitalist service for SIRS secondary to pneumonia. Initial white count 14.3. He was started on IV fluids and IV Levaquin. Blood cultures, sputum cultures are ordered. The patient unable to give the sputum, but blood cultures were negative and the patient remained afebrile after the antibiotics.  His white count came back to 9.3 and he remained afebrile and he is remaining hemodynamically stable with heart rate around 79 and blood pressure 117/68. The patient tolerated the Levaquin and he is on Levaquin every 48 hours. I ordered 5 more  doses of Levaquin to finish. 1. pneumonia;treated,afbrile, 2. Hypertension, controlled with his isosorbide. 3. He has hyperlipidemia. He is on Crestor, continue that.  4. Hyponatremia due to dehydration. The patient's sodium on admission 128, improved with fluids. Repeat sodium 136 on 07/10. 5. Somnolence.  The patient was on Ambien when he came and he just started on mirtazapine. According to the son he was just started on mirtazapine before he came, but that combination made him very sleepy and did not participate in physical therapy, so we had discontinued the mirtazapine and also Ambien. The patient became more alert, oriented, and participated in physical therapy. The patient's physical therapy recommended rehab placement, so we have bed offers from [place] and the son accepted the bed offer from [place].  The patient also was seen by speech therapy. They recommended mechanical soft with thin liquids and that can be continued there.   TIME SPENT ON DISCHARGE PREPARATION: More than 30 minutes.    ____________________________ Epifanio Lesches, MD sk:rw D: 10/22/2012 12:04:49 ET T: 10/22/2012 12:27:06 ET JOB#: [id number]  cc: Epifanio Lesches, MD, <Dictator> Bethena Roys. Ancil Boozer, MD Epifanio Lesches MD ELECTRONICALLY SIGNED 10/27/2012 14:17

## 2014-07-31 NOTE — Discharge Summary (Signed)
PATIENT NAMERUBEL, Moore MR#:  [id number] DATE OF BIRTH:  29-Sep-1920  DATE OF ADMISSION:  10/23/2012 DATE OF DISCHARGE:  10/25/2012   DISCHARGE DIAGNOSES: 1. Breakthrough seizure, likely secondary to Levaquin. The patient has no seizure disorder. 2. Pneumonia.  3. Hypertension.  4. Prostate cancer.   DISCHARGE MEDICATIONS:  1. Isosorbide mononitrate 60 mg p.o. daily. 2. Vitamin B12 1000 mcg p.o. t.i.d. 3. Avodart 0.5 mg p.o. daily. 4. Neurontin 100 mg p.o. daily. 5. Finasteride 5 mg p.o. daily.  6. Aspirin 81 mg daily.  7. Azithromycin 500 mg p.o. daily for 5 days.  Crestor has been discontinued.   CONSULTATIONS: None.   PROCEDURES: Electroencephalogram.   HOSPITAL COURSE: This is an 81 year old male patient who was discharged by me 2 days ago for pneumonia. The patient went to [facility]. The patient was given Levaquin, and the patient developed seizure which was tonic-clonic seizure at the rehab place, and so he was brought in and put on observation status. Levaquin was promptly stopped, and I spoke with Dr. Jennings Books, who spoke to me over the phone and recommended most likely Levaquin-induced seizure. Ordered an EEG and MRI of the brain. MRI of the brain is negative for any acute structural lesions or any other pathology. EEG also did not show any epileptiform discharges. The patient did not have any further seizures after he was here and has been doing well, alert and oriented. Did not require any Ativan. Dr. Manuella Ghazi has mentioned usually stopping the Levaquin should help with seizures, and if the patient does not have any more seizures, the patient does not need to be on any antiepileptics. The same is discussed with the son, and the patient will be given azithromycin. HE IS ALLERGIC TO PENICILLIN, AND ALSO WILL DOCUMENT LEVAQUIN AS ALLERGY. He will be given Zithromax for 5 days for pneumonia. His other vitals are stable, and the patient is stable for going back to [facility]  today. The patient's other labs were also normal. The other abnormalities: He had slightly elevated AST and ALT here, ALT of 124, AST 114, so his statins were stopped. He got an ultrasound of the abdomen which showed multiple mobile gallstones, but no cholecystitis. The patient has no abdominal pain, no nausea, and so we did not ask for surgical consult. The patient also had echocardiogram which showed EF of 55% to 60% with some impaired relaxation of LV with diastolic filling defect pattern. Troponins have been negative. Needs repeat blood work for liver functions and can follow up with Dr. Steele Sizer in 1 to 2 weeks.   TIME SPENT ON DISCHARGE PREPARATION: More than 30 minutes, and I spoke with the patient's son over the phone and explained about the hospital course.   ____________________________ Epifanio Lesches, MD sk:OSi D: 10/25/2012 09:34:05 ET T: 10/25/2012 09:46:13 ET JOB#: [id number]  cc: Epifanio Lesches, MD, <Dictator> Bethena Roys. Ancil Boozer, MD Epifanio Lesches MD ELECTRONICALLY SIGNED 11/04/2012 18:38

## 2014-07-31 NOTE — H&P (Signed)
[name], [name] MR#:  [id number] DATE OF BIRTH:  1920/10/12  DATE OF ADMISSION:  10/23/2012  PRIMARY CARE PHYSICIAN:  Dr. Ancil Boozer, M.D.   CHIEF COMPLAINT: Seizures, syncope.   HISTORY OF PRESENTING ILLNESS: The Brad is a pleasant, 81 year old, Caucasian, male Brad who was recently discharged from the hospital on 10/22/2012 after being treated for left lower lobe pneumonia and generalized weakness. The Brad was sent to [facility].   Today, the Brad worked with physical therapy was extremely tired after his session. The Brad went and sat on the commode when he rolled his eyes and had about 10-second episode of rigidity and generalized tonic-clonic seizure. He had 5 to 10 minute postictal period along with some slurred speech. Presently, he is back to baseline.  He is oriented to self and situation which is his baseline. Does seem to have generalized weakness. Had loss of consciousness with the seizure.   The Brad does not contribute much to history. He mentions he had some left-sided chest pain previously which is improved now. No nausea, vomiting, abdominal pain. He does not remember the episode. No history of seizures.   History obtained from his niece, daughter-in-law, ER physician, nursing staff and old records.   PAST MEDICAL HISTORY: 1. Hypertension.  2. Hyperglycemia.  3. Hyponatremia in the past.  4. Prostate cancer.  5. Hyperlipidemia.  6. B12 deficiency.  7. Dementia.   ALLERGIES: PENICILLIN.   HOME MEDICATIONS:  Include: 1. Aspirin 81 mg daily.  2. Avodart 0.5 mg daily.  3. Crestor 20 mg at bedtime.  4. Finasteride 5 mg daily.  5. Gabapentin 800 mg at bedtime.  6. Imdur 60 mg extended-release oral daily.  7. Vitamin B 1000 mcg oral 3 times a day.  8. Levaquin 50 mg every 48 hours, last dose taken today.   SOCIAL HISTORY: The Brad used to live at home with 24-hour care; presently, he is at [facility]. Does not smoke. No alcohol.  No illicit drugs.   CODE STATUS: Full code.   FAMILY HISTORY: Positive for diabetes, hypertension, and CVA.   REVIEW OF SYSTEMS: The Brad is a poor historian; 12-systems reviewed, he only complains of the left-sided chest pain, which had in the past during the pneumonia, which has resolved. Repeating concerns about this symptom.   PHYSICAL EXAMINATION:  VITAL SIGNS: Temperature 97, pulse of 78, respirations 20, blood pressure 129/65, saturating 98% on room air.   GENERAL: Elderly, pleasant, African American male Brad lying in bed and cooperative with exam.   HEENT: Atraumatic, normocephalic. Oral mucosa moist and pink. No oral ulcers or thrush. External ears and nose normal. No pallor. No icterus. Pupils bilaterally equal and reactive to light.   NECK: Supple. No thyromegaly. No palpable lymph nodes. Trachea midline. No carotid bruit, JVD.   CARDIOVASCULAR: S1, S2 without any murmurs. Occasional PVCs.   RESPIRATORY: Normal work of breathing. Clear to auscultation on both sides.   GASTROINTESTINAL: Soft abdomen, nontender. Bowel sounds present. No hepatosplenomegaly palpable.   GENITOURINARY: No CVA tenderness or bladder distention.   SKIN: Warm and dry. No petechiae, rash, ulcers.   MUSCULOSKELETAL: No joint swelling, redness, effusion of the large joints. Normal muscle tone.   NEUROLOGICAL: Motor is at 5-/5 in upper and lower extremities. Sensation is intact all over. Babinski's downgoing. No nystagmus. No facial droop.   LYMPHATIC: No cervical, supraclavicular lymphadenopathy.   LABORATORY DATA: Glucose 136, BUN 12, creatinine 1.21, sodium 133, potassium 4, chloride 99, GFR 53. AST,  ALT is elevated at 114 and 124; these were 34 and 25 a week back. WBC 8.5, hemoglobin 12.7, platelets of 361. Urinalysis shows no bacteria.   EKG shows normal sinus rhythm with PVCs and PACs.   ASSESSMENT AND PLAN: Syncope and seizure.  The Brad does not have any history of seizures. He  could have had convulsive syncope. The Brad is also on Levaquin which lowers the seizure threshold; but with no history of seizures, I doubt this is causing any problems but will discontinue Levaquin at this time. We will get an EEG of the brain, CT scan of the head has been normal. No seizure medications at this time. We will wait for the EEG report. The Brad will be on seizure precautions. Does not have any significant endocrine abnormalities or signs of infection at this time. We will check an MRI of the brain. We will also get an echocardiogram for his syncope. Check 2 more sets of cardiac enzymes. The Brad does have multiple PVCs and PACs. This could have caused syncope. We will check a magnesium level.  1. Mild hyponatremia. The Brad has had chronic mild hyponatremia. This is at baseline.  2. Acute hepatitis with elevated AST, ALT more than twice normal. These were at 34 and 25 during recent admission; presently at 140 by 124.  We will stop the statin. Check hepatitis A, B, and C. Will also check an ultrasound of the abdomen.  3. Dementia. The Brad has had problems with inpatient delirium during the recent admission, we will have to watch out for this.  4. Hypertension. Continue medications.  5. Deep venous thrombosis prophylaxis with Lovenox.   CODE STATUS: Full code.   TIME SPENT TODAY ON THIS CASE: 50 minutes.     ____________________________ Leia Alf Jora Galluzzo, MD srs:rw D: 10/23/2012 17:51:14 ET T: 10/23/2012 18:30:41 ET JOB#: [id number]  cc: Alveta Heimlich R. Lewellyn Fultz, MD, <Dictator> Bethena Roys. Ancil Boozer, MD Neita Carp MD ELECTRONICALLY SIGNED 10/27/2012 23:41

## 2014-07-31 NOTE — H&P (Signed)
PATIENT NAMEMICHAELJAMES, MILNES MR#:  [id number] DATE OF BIRTH:  1920-10-15  DATE OF ADMISSION:  10/16/2012  PRIMARY CARE PHYSICIAN: Steele Sizer, MD   CHIEF COMPLAINT: High-grade fever of 102 and getting weak for the last 2 to 3 days.   HISTORY OF PRESENT ILLNESS: Mr. [name] is a very pleasant 81 year old gentleman with past medical history of hypertension, hyperglycemia and history of prostate cancer who comes to the Emergency Room accompanied by his son with the above-mentioned chief complaint. The patient has been noted by family members getting weak daily.  With 2-people assist, normally he is  able to get around and do his activities himself. He has had poor p.o. intake and started having a cough which is dry since yesterday evening. He also started complaining of some left-sided chest pain and was found to be tachycardic with heart rate 127, tachypneic, respiratory rate more than 26 and a fever of 102. He was found to have left lower lobe pneumonia, being admitted for SIRS due to pneumonia.   PAST MEDICAL HISTORY: 1.  Hypertension.  2.  Hyperglycemia.  3.  History of hyponatremia in the past.  4.  History of prostate cancer.  5.  Hyperlipidemia.  6.  B12 deficiency.   ALLERGIES: PENICILLIN.   MEDICATIONS: 1.  Aspirin 81 mg daily.  2.  Avodart 0.5 mg daily.  3.  Crestor 20 mg at bedtime.  4.  Finasteride 5 mg daily.  5.  Gabapentin 100 mg at bedtime.  6.  Imdur 60 mg extended-release p.o. daily.  7.  Remeron 15 mg at bedtime.  8.  Vitamin B12 1000 mcg p.o. t.i.d.  SOCIAL HISTORY: Lives at home. Family members take turns to be with him every day.  There is a nighttime sitter as well. No tobacco or alcohol abuse at present.   FAMILY HISTORY: Positive for diabetes, hypertension and stroke.    REVIEW OF SYSTEMS:  CONSTITUTIONAL: Positive for fatigue, fever and weakness.  EYES: No blurred or double vision, glaucoma.  ENT: No tinnitus, ear pain, hearing loss.  RESPIRATORY: No cough,  wheeze or hemoptysis. Positive for cough and pleuritic chest pain.  CARDIOVASCULAR: Positive for pleuritic left-sided chest pain along with shortness of breath. No hypertension or dyspnea on exertion or palpitations.  GASTROINTESTINAL: No nausea, vomiting, diarrhea or GERD.  GENITOURINARY: No dysuria, hematuria or frequency.  ENDOCRINE: No polyuria, nocturia or thyroid problems.  HEMATOLOGY: No anemia or easy bruising.  SKIN: No acne or rash.  MUSCULOSKELETAL: Positive for arthritis.  NEUROLOGIC: No CVA, TIA.  Positive for generalized weakness. No dysarthria. PSYCHIATRIC:  No anxiety or depression. All other systems reviewed and negative.   PHYSICAL EXAMINATION: GENERAL: The patient is awake, alert, oriented x 2, not in acute distress.  VITAL SIGNS: Temperature 102.1, pulse is 100 to 120 regular, respirations are 26. Blood pressure was 149/76.  Sats are  94% on room air. The patient's current pulse is 87 to 89, regular during my evaluation.  HEENT: Atraumatic, normocephalic. PERRLA. EOM intact. Oral mucosa is moist.  NECK: Supple. No JVD. No carotid bruit.  RESPIRATORY: There are decreased breath sounds in the bases. No respiratory distress or labored breathing.  CARDIOVASCULAR:  Both the heart sounds are normal. Rate is tachycardic.  Rhythm is regular. No murmur heard. PMI not lateralized. Chest nontender.  EXTREMITIES: Good pedal pulses, good femoral pulses. No lower extremity edema.  ABDOMEN: Soft, benign, nontender. No organomegaly. Positive bowel sounds.  NEUROLOGIC: Grossly intact cranial nerves II through XII. Generalized  weakness present. No focal motor deficit.  SKIN: Warm and dry.    LABORATORY AND RADIOLOGICAL DATA:  Chest x-ray consistent with left lower lobe pneumonia. White count is 14.3, H and H are 12.9 and 38.0, platelet count is 240.  Glucose is 195, BUN is 12, creatinine is 1.04, sodium is 128, potassium 3.9, chloride is 99, bicarbonate is 24, calcium is 8.8, bilirubin is  0.7, alk phos is 92. LFTs within normal limits. Troponin less than 0.02. UA negative for UTI.   ASSESSMENT AND PLAN: Mr. [name] is an 81 year old with history of prostate cancer, hypercholesterolemia and hypertension with borderline diabetes, comes into the Emergency Room with a cough, left-sided pleuritic chest pain, high-grade fever of 102, tachycardia and tachypnea.  He is being admitted with:   1.  Systemic inflammatory response syndrome secondary to left lower lobe pneumonia. We will admit the patient to the medical floor. Regular diet. We will continue IV fluids. IV Levaquin q. 24.  Follow blood cultures, sputum culture if patient is able to produce sputum.  Breathing treatment as needed.  2.  Hyponatremia secondary to poor p.o. intake in the setting of infectious process. We will give IV fluids, encourage p.o. fluids and p.o. diet. Monitor serum sodium.  3.  Hypertension.  I  will resume the patient's Imdur.  4.  Hyperlipidemia. On Crestor.  5.  History of prostate cancer with possible benign prostatic hypertrophy. The patient is on Avodart and finasteride, which we will continue.  6.  Generalized weakness secondary to dehydration and pneumonia. We will have Physical Therapy see patient. Care management for discharge planning.  7.  Deep vein thrombosis prophylaxis. Subcutaneous heparin.   The above was discussed with the patient's daughter-in-law and son ,who is agreeable to it.   CRITICAL TIME SPENT: 60 minutes.   ____________________________ Hart Rochester Posey Pronto, MD sap:cb D: 10/16/2012 19:47:48 ET T: 10/16/2012 20:06:04 ET JOB#: [id number]  cc: Sana Tessmer A. Posey Pronto, MD, <Dictator> Bethena Roys. Ancil Boozer, MD Ilda Basset MD ELECTRONICALLY SIGNED 10/28/2012 19:07

## 2014-08-01 NOTE — Discharge Summary (Signed)
PATIENT NAMEASHE, [name] MR#:  [id number] DATE OF BIRTH:  [birth year]  DATE OF ADMISSION:  04/14/2013 DATE OF DISCHARGE:  04/18/2013   Addendum  The patient did not want to go home last night. He actually was discharged in good condition, but the family said that they were wanting to take him home as the facilities that he was offered to were not available or were not of their convenience.   The patient stayed overnight despite the fact that he was discharged, and now today, we are working on getting him to a facility. We are going to try to get him to a skilled nursing facility with the same recommendations as prior discharge from yesterday.   I spent about 60 minutes talking to the family and the patient today.   ____________________________ [place]addo Sink, MD rsg:lb D: 04/18/2013 13:22:37 ET T: 04/18/2013 14:08:05 ET JOB#: [id number]  cc:  Sink, MD, <Dictator> [name] MD ELECTRONICALLY SIGNED 05/04/2013 8:15

## 2014-08-01 NOTE — Discharge Summary (Signed)
PATIENT NAMEKENTRELL, [name] MR#:  [id number] DATE OF BIRTH:  10/16/1920  DATE OF ADMISSION:  04/14/2013 DATE OF DISCHARGE:  04/17/2013  ADMISSION DIAGNOSES:  Altered mental status and weakness.   DISCHARGE DIAGNOSES: 1.  Acute metabolic encephalopathy.  2.  Dementia.  3.  Systemic inflammatory response syndrome.  4.  No site of infection detected.  5.  Knee trauma after a fall.  6.  Dehydration.   MEDICATIONS: Finasteride 5 mg once daily, aspirin 81 mg daily, isosorbide mononitrate 60 mg once a day, Namenda XR 28 mg once a day.   HOSPITAL COURSE: This is a very nice 81 year old gentleman with history of dementia, who lives at home by himself with the help of his family.   The patient was brought to the hospital with the complaint of generalized weakness, altered mental status. The patient has been recently diagnosed and treated with pneumonia. Antibiotics have been completed for over 2 days before admission.   The patient had some slow deterioration with hallucinations, confusion, and then was starting  to get extremely weak to the point that he was not able to walk. He had also 1 fall hitting his knee, but there were no significant broken bones of trauma.   The patient was found to be tachycardic at 112, respirations of 20. Afebrile during the whole hospitalization. He looked dehydrated. He was found to have a little increase on his creatinine of 1.5. His sodium was 130.   After hydration, his sodium went up to 134. His ammonia was normal. His electrolytes overall were stable, except for the sodium being low. Other electrolytes were in their normal range.   His LFTs were normal. He had a white count of 12,000 on admission, but then after followup, it was 9000.  This was secondary to hemoconcentration. His vitamin B12 level was 577, which is normal. His urinalysis did not show any signs of urinary tract infection.   EKG: Tachycardic with possible atrial fibrillation with rapid ventricular  response, then normal sinus rhythm.  The patient got admitted to the hospital, treated for dehydration. Antibiotics were started for possible urinary tract infection and then stopped. The patient has been doing well, clearing very nicely after a couple of days, now back to his baseline.   The only problem is that he is still weak. He has not been able to ambulate independently, which he does at home. The family has asked to take him to a skilled nursing facility. We sent request. There was some problems with insurance authorization for a while, but after all, they approved him, and now the facilities that are available are not being accepted by the family, for which we are going to discharge him home with home health.   The patient did well. No major medical complications during this hospitalization.   I spent about 45 minutes with this discharge.    DISPOSITION: Home.  Follow up PCP in 1 to 2 weeks.   ____________________________ [place] Sink, MD rsg:dmm D: 04/17/2013 16:18:02 ET T: 04/17/2013 23:01:24 ET JOB#: [id number]  cc: Sharon Sink, MD, <Dictator> Varonica Siharath America Brown MD ELECTRONICALLY SIGNED 05/04/2013 8:15

## 2014-08-01 NOTE — H&P (Signed)
**Note [name]** [name], SEARFOSS MR#:  [id number] DATE OF BIRTH:  [birth year]  DATE OF ADMISSION:  04/14/2013  PRIMARY CARE PHYSICIAN: Steele Sizer, MD  CHIEF COMPLAINT: Altered mental status and weakness.   HISTORY OF PRESENT ILLNESS: This is a 81 year old African American male Brad who lies at home and ambulates normally with a walker and carries a conversation although he does have Alzheimer's dementia who presents to the hospital brought in by the son with complaints of generalized weakness and altered mental status. The Brad was recently diagnosed with pneumonia a week prior treated with antibiotics, seemed to be doing well but over the past 2 days has slowly deteriorated, has had some hallucinations, confusion yesterday and today has been extremely weak, unable to get out of bed, and is not talking. He does wake up on calling his name but is drowsy.   He has not had any syncope. The Brad does not contribute any history. He does complain of some pain in his right hip area.   He has had decreased appetite over the last 2 days.   History has been obtained from his son, ER physician, nursing staff and reviewing old records.   REVIEW OF SYSTEMS: Unobtainable secondary to the Brad's encephalopathy.  PAST MEDICAL HISTORY: 1.  Hypertension.  2.  Hyperglycemia.  3.  Hyponatremia.  4.  Prostate cancer.  5.  Hyperlipidemia.  6.  B12 deficiency.  7.  Dementia.  8.  Levaquin induced seizures.   ALLERGIES: PENICILLIN.   SOCIAL HISTORY: The Brad does not smoke. No alcohol. No illicit drugs. Lives at home with his son. Ambulates with a walker. Does have dementia.   FAMILY HISTORY: Positive for diabetes, hypertension and CVA.   CODE STATUS: DNR/DNI.   HOME MEDICATIONS: Include:  1.  Aspirin 81 mg daily.  2.  Finasteride 5 mg oral once a day.  3.  Isosorbide mononitrate 60 mg daily.  4.  Namenda XR 28 mg daily.  5.  Codeine cough syrup, started recently.   PHYSICAL  EXAMINATION: VITAL SIGNS: Temperature 98.1, pulse 112, respirations 20, blood pressure 162/75, saturating 92% on 3 liters oxygen.  GENERAL: Elderly African American male Brad lying in bed, drowsy. Wakes up on calling his name but falls back to sleep.  PSYCHIATRIC: Drowsy, flat affect.  HEENT: Atraumatic, normocephalic. Oral mucosa dry and pink. External ears and nose normal. No pallor. No icterus. Pupils bilaterally equal and reactive to light. NECK: Supple. No thyromegaly or palpable lymph nodes. Trachea midline. No carotid bruit or JVD.  CARDIOVASCULAR: S1 and S2 without any murmurs. Tachycardic. Peripheral pulses 2+. No edema.  RESPIRATORY: Normal work of breathing. Clear to auscultation on both sides.  GASTROINTESTINAL: Soft abdomen, nontender. Bowel sounds present. No hepatosplenomegaly palpable.  SKIN: Warm and dry. No petechiae, rash or ulcers.  MUSCULOSKELETAL: No joint swelling, redness or effusion of the large joints. Normal muscle tone. NEUROLOGIC: Babinski's downgoing on both sides. Withdraws to pain. Does follow commands.  LYMPHATIC: No cervical lymphadenopathy.   LABORATORY AND DIAGNOSTICS: Labs today show glucose 161, BUN 12, creatinine 1.05, sodium 130, potassium 4.2, chloride 97. GFR greater than 60. AST, ALT, alkaline phosphatase and bilirubin normal. Troponin less than 0.02. WBC 12.8, hemoglobin 14.7 and platelets 314.   Urinalysis shows no bacteria, less than 1 WBC.   EKG shows sinus tachycardia. Occasional PACs.   Chest x-ray is significantly rotated. Chronic changes. No clear infiltrate visible. Elevated left hemidiaphragm. Cardiomegaly.   ASSESSMENT AND PLAN: 1.  Acute encephalopathy in a  Brad with dementia and recently treated for pneumonia. The Brad does meet criteria for systemic antiinflammatory response syndrome with tachycardia and elevated white count of 12.8, although no clear source of infection other than the recently treated pneumonia. We will repeat  a chest x-ray in the morning, put him on IV antibiotics. The Brad does seem dehydrated which could be contributing to his encephalopathy. He will be on IV fluids. CT scan of the head has been done and shows old stroke, nothing acute. We will review in 24 hours and further management decided. No urinary tract infection. We will check an ammonia and B12 level along with TSH. The Brad will be on fall precautions. Repeat chest x-ray in the morning.  2.  Dehydration secondary to decreased oral intake. Start IV fluids.  3.  Hyponatremia. Contributed by dehydration but the Brad has had hyponatremia in the past. We will monitor sodium. Repeat labs in the morning.  4.  The Brad's encephalopathy could also be contributed by the codeine cough syrup he is on which will be held at this time.  5.  Levaquin-induced seizure. Will start him on ceftriaxone and azithromycin at this time and avoid Levaquin.  6.  Deep venous thrombosis prophylaxis. Lovenox.   CODE STATUS: DNR/DNI as discussed with his healthcare power of attorney, his son.   TIME SPENT TODAY ON THIS CASE: 55 minutes. ____________________________ Leia Alf Mayur Duman, MD srs:sb D: 04/14/2013 15:52:56 ET T: 04/14/2013 16:41:01 ET JOB#: [id number]  cc: Alveta Heimlich R. Marcellina Jonsson, MD, <Dictator> Bethena Roys. Ancil Boozer, MD Neita Carp MD ELECTRONICALLY SIGNED 04/17/2013 11:14

## 2014-08-04 IMAGING — CR DG KNEE COMPLETE 4+V*R*
1 series · 5 of 5 positions shown · non-contrast
Comparison: None.

CLINICAL DATA: A edema of the right knee.

EXAM:
RIGHT KNEE - COMPLETE 4+ VIEW

[Series 1: oblique · 0.17mm/px · 5 of 5 slices shown]
[im 1/5]
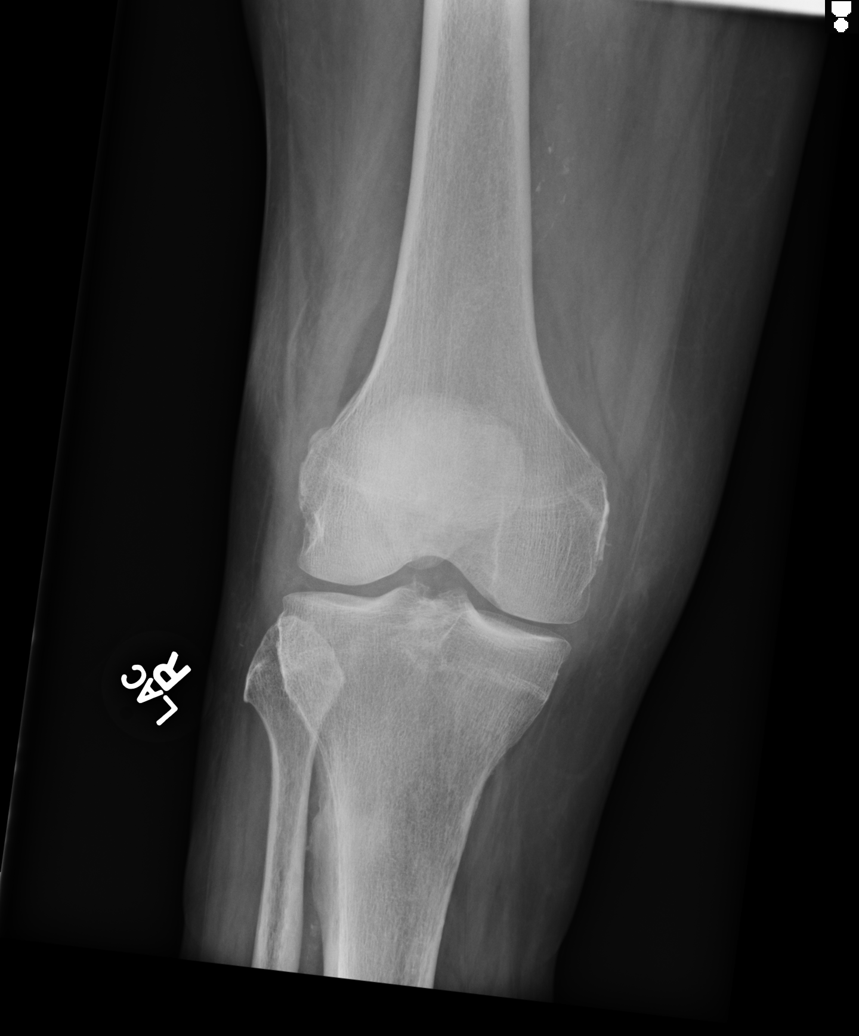
[im 2/5]
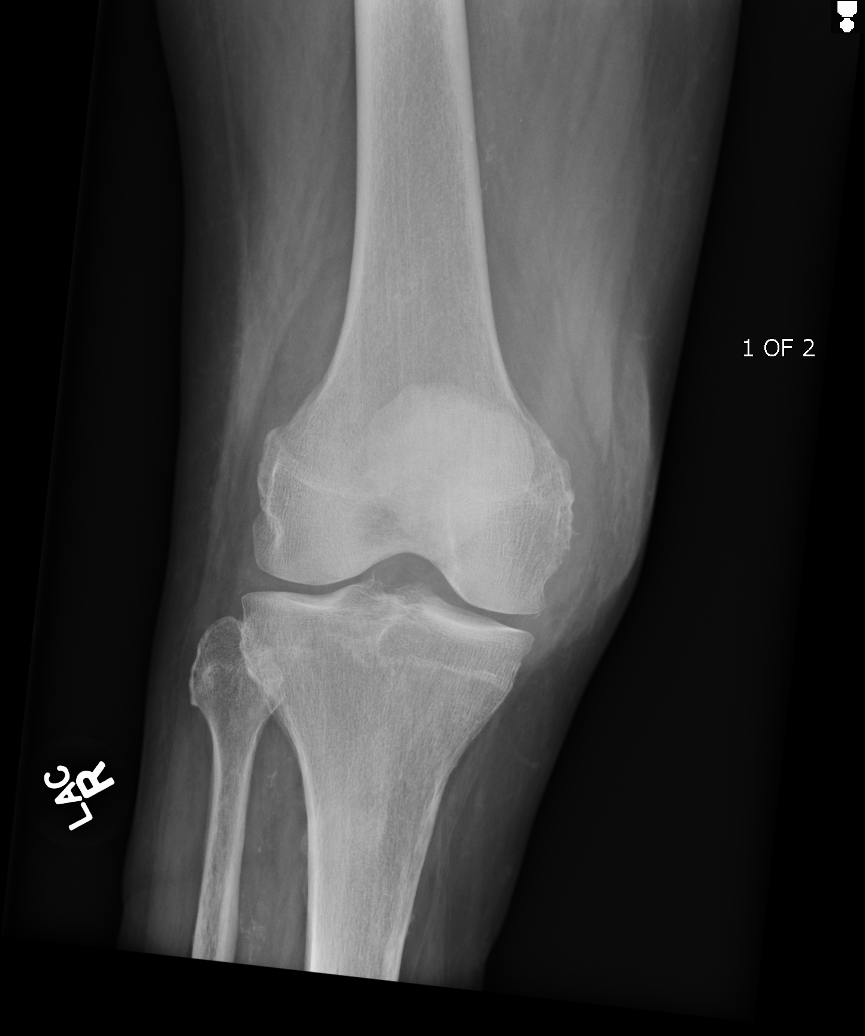
[im 3/5]
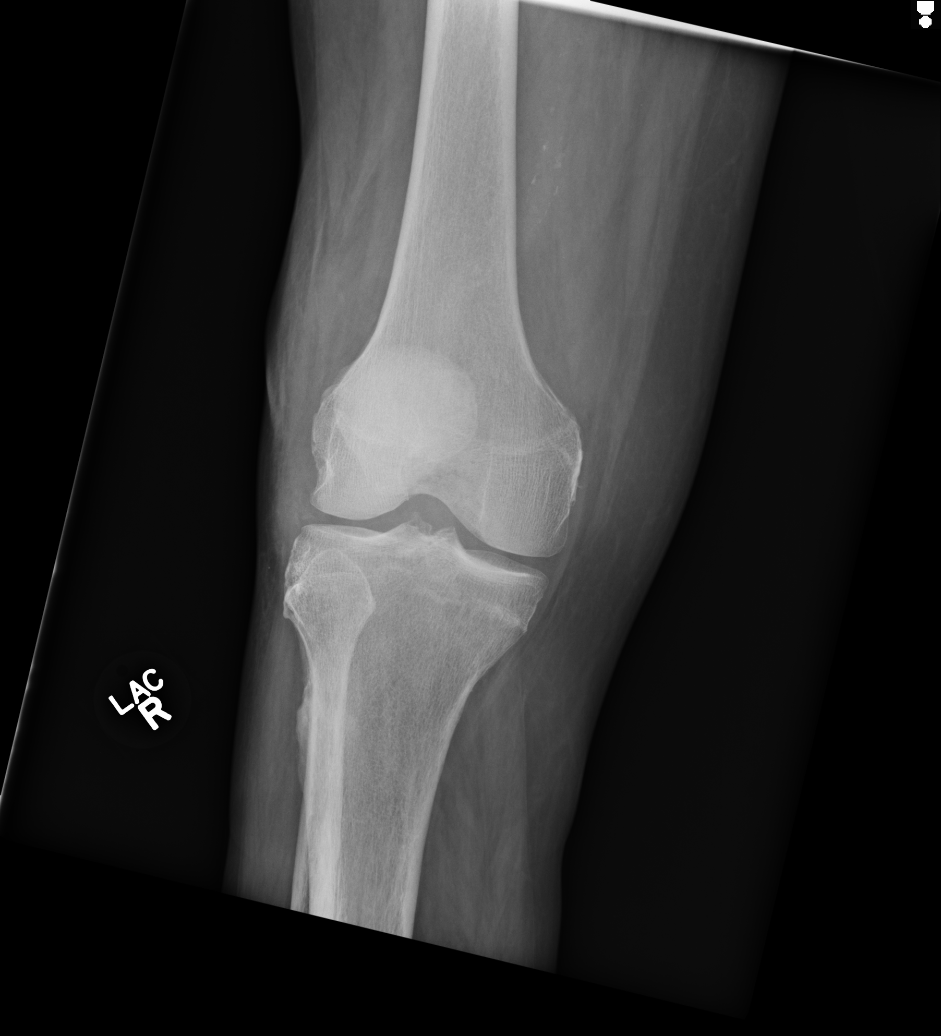
[im 4/5]
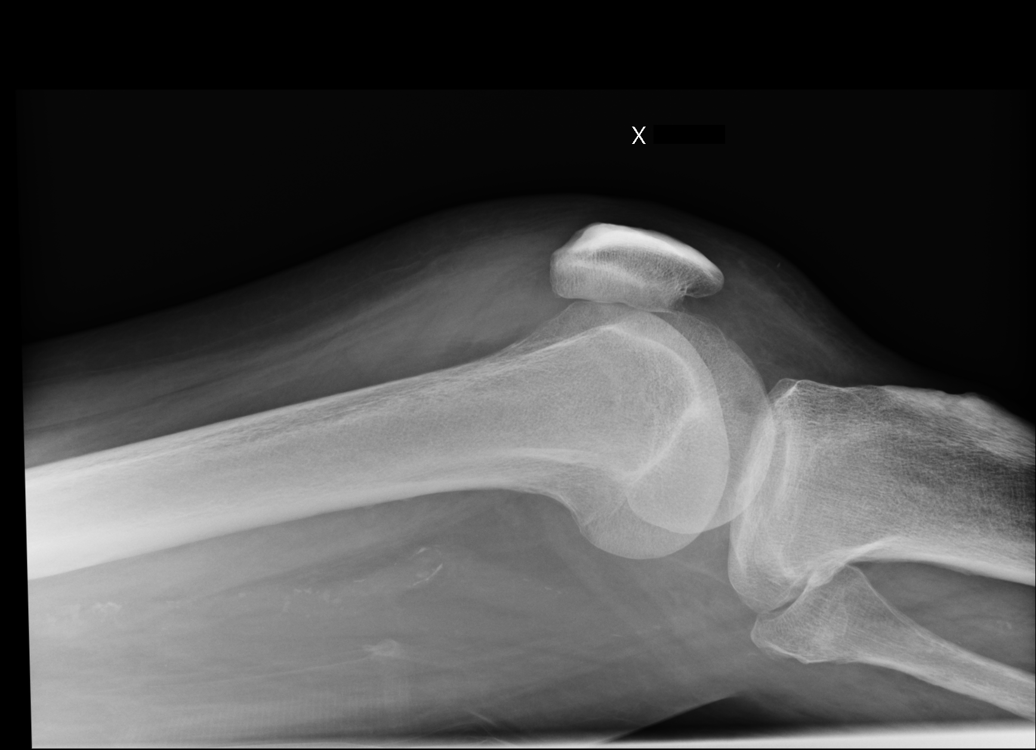
[im 5/5]
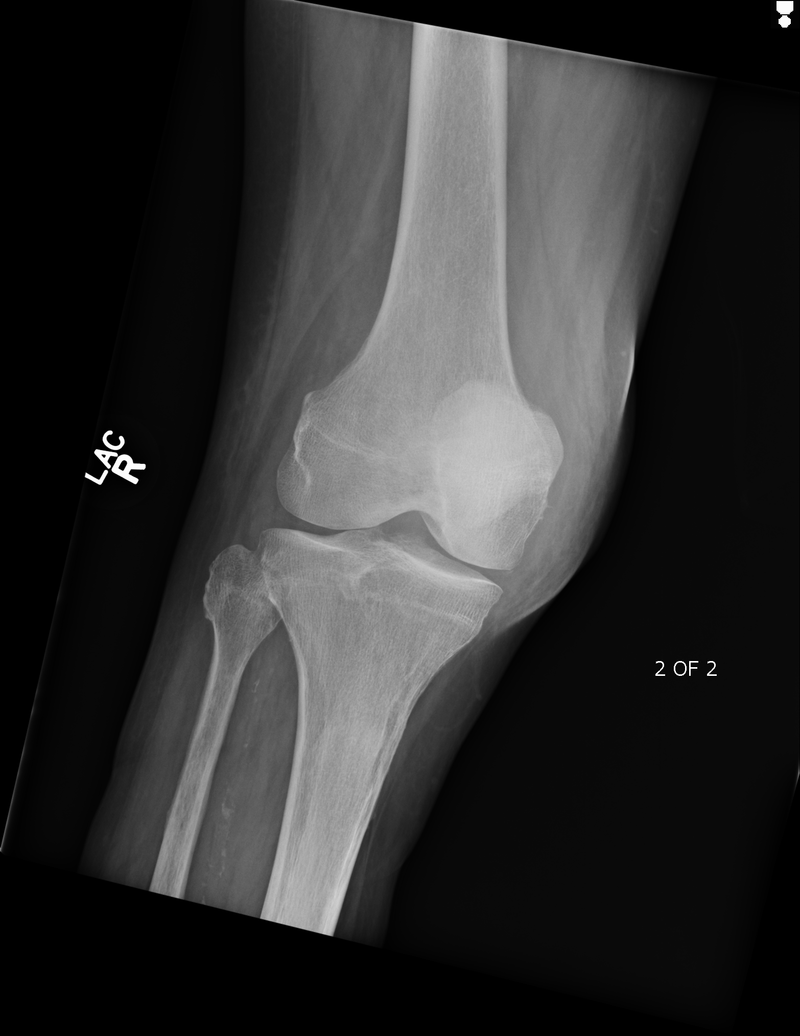

[5 of 5 positions shown; findings below may reference images not displayed]

FINDINGS: Five views of the right knee reveal the bones to be reasonably well
mineralized for age. No lytic or blastic lesion. There is no
evidence of an acute or healing fracture nor evidence of
dislocation. There are mild degenerative changes involving all 3
joint compartments. There is no evidence of a joint effusion. The
soft tissues demonstrate no suspicious calcifications. There are
faint vascular arterial calcifications.
IMPRESSION: 1. There is no evidence of a joint effusion.
2. There are very mild degenerative changes of all 3 joint
compartments of the right knee.
3. There is no evidence of an acute or healing fracture nor evidence
of dislocation.

## 2014-08-09 NOTE — H&P (Signed)
PATIENT NAMEBRENTEN, [name] MR#:  [id number] DATE OF BIRTH:  1920-10-05  DATE OF ADMISSION:  05/26/2014  PRIMARY CARE PROVIDER: Bethena Roys. Sowles, MD  CHIEF COMPLAINT: Weakness, unable to walk.   HISTORY OF PRESENTING ILLNESS: A 81 year old African American male patient with history of dementia and recurrent urinary tract infection, prostate cancer, who ambulates with a walker at baseline with 24 hour care at home. Presents to the Emergency Room, brought in by the son, due to weakness. The patient was taken to his primary care physician's office who was concerned about his weakness and sent the patient to the ER. The only complaint the patient had, per his son, was some right lower extremity pain and an acute femoral deep venous thrombosis has been found on ultrasound. Presently, the patient seems to have sepsis with UTI along with worsening weakness and confusion and is being admitted to the hospitalist service.   A CT chest was done, which shows no PE.   History is unable to be obtained from the patient due to his confusion, dementia.   PAST MEDICAL HISTORY: 1.  Hypertension.  2.  Borderline diabetes.  3.  Chronic hyponatremia.  4.  Prostate cancer.  5.  Hyperlipidemia.  6.  B12 deficiency. 7.  Dementia.  8.   seizures.   ALLERGIES: PENICILLIN.   SOCIAL HISTORY: The patient does not smoke. No alcohol. No illicit drugs. Lives at home with his son with 24 hour caregivers. Ambulates with a walker at baseline. He does have advanced dementia.   FAMILY HISTORY: Diabetes, hypertension, CVA.   CODE STATUS: DO NOT RESUSCITATE, DO NOT INTUBATE.  REVIEW OF SYSTEMS:  Unobtainable secondary to the patient's encephalopathy, dementia.   HOME MEDICATIONS: 1.  Isosorbide mononitrate 60 mg daily.  2.  Namenda XR 28 mg daily.  3.  Finasteride 5 mg daily. 4.  Flomax 0.4 mg.  5.  Aspirin 81 mg daily.   PHYSICAL EXAMINATION: VITAL SIGNS: Temperature 97.9, pulse of 112, blood pressure 168/78,  saturating 98% on room air.  GENERAL: Moderately built [place] American male patient lying in bed, overall seems calm, drowsy. PSYCHIATRIC:  Drowsy, but wakes up on calling his name. Oriented to person, but not place, and time.  HEENT: Atraumatic, normocephalic. Oral mucosa dry and pink, pallor positive. No icterus. Pupils bilaterally equal and react to light.  NECK: Supple. No thyromegaly. No palpable lymph nodes. Trachea midline. No carotid bruit or JVD.  CARDIOVASCULAR: S1, S2, tachycardic with no murmurs. Peripheral pulses 2+. No edema. RESPIRATORY: Normal work of breathing, clear to auscultation on both sides. GASTROINTESTINAL: Soft abdomen, nontender. Bowel sounds present. No hepatosplenomegaly palpable. SKIN:  Warm and dry. No petechiae, no rash, or ulcers.  MUSCULOSKELETAL:  No joint swelling, redness, effusion of large joints. Normal muscle tone.  NEUROLOGIC:  Motor strength 5/5 in lower extremities and 4+/5 in upper extremities.  LYMPHATIC: No cervical lymphadenopathy.   LABORATORY STUDIES: Glucose 179, BUN 16, creatinine 1.09, sodium 133, potassium 4.2, chloride 100. AST, ALT, alkaline phosphatase, bilirubin normal. Troponin less than 0.02. WBC 14, hemoglobin 14.8, platelets of 225,000, with INR of 1.2. Urinalysis shows 1+ bacteria and less than 1 WBC.   EKG shows normal sinus rhythm with occasional PACs.   Venous Doppler of the lower extremities shows positive exam for right mid to distal femoral acute DVT.   CT scan of the chest without contrast with PE protocol shows chronic T9 compression deformity. No pulmonary embolism. Mild dependent atelectatic changes.   ASSESSMENT AND PLAN:  1.  Urinary tract infection with sepsis, acute encephalopathy, and weakness. The patient will be started on ceftriaxone, send for urine cultures. The patient follows with Dr. Jacqlyn Larsen, where cultures were drawn 2 days back, but the son is not aware of the results. These need to be checked tomorrow as his  office is closed at this point. The patient does have elevated white count, tachycardia, and acute encephalopathy. Further management as per culture results. Consult physical therapy. The patient may need to go to rehabilitation due to his weakness or can be discharged home. He does have hospice following him.  2.  Acute right femoral deep vein thrombosis. This is secondary to the patient being immobile and bedbound. At this point, he will be started on Xarelto. He needs to be treated for at least 3 months. According to the son he has not had any recent falls.  3.  Hypertension. Continue his home medications.  4.  Chronic hyponatremia, stable.  5.  Dementia. Watch for any inpatient delirium.  6.  Deep vein thrombosis prophylaxis. The patient is on treatment for deep vein thrombosis.   CODE STATUS: DO NOT RESUSCITATE, DO NOT INTUBATE.   TIME SPENT TODAY ON THIS CASE: 40 minutes.   ____________________________ Leia Alf Kore Madlock, MD srs:LT D: 05/26/2014 19:38:43 ET T: 05/26/2014 20:38:36 ET JOB#: [id number]  cc: Alveta Heimlich R. Moritz Lever, MD, <Dictator> Bethena Roys. Ancil Boozer, MD Neita Carp MD ELECTRONICALLY SIGNED 05/27/2014 16:46

## 2014-08-09 NOTE — Discharge Summary (Signed)
PATIENT NAMEARUSH, [name] MR#:  [id number] DATE OF BIRTH:  1920-09-17  DATE OF ADMISSION:  05/26/2014 DATE OF DISCHARGE:  05/28/2014  ADMITTING DIAGNOSES: Weakness, difficulty with ambulation.   DISCHARGE DIAGNOSES:  1.  Weakness, difficulty with ambulation due to urinary tract infection, some dehydration.  2.  Acute lower extremity deep venous thrombosis prior to coming to the hospital, now on Xarelto.  3.  Hypertension.  4.  Borderline diabetes.  5.  Chronic hyponatremia.  6.  Prostate cancer.  7.  Hyperlipidemia.  8.  History of B12 deficiency.  9.  Dementia.  10.  History of seizures.   CONSULTANTS: None.   PERTINENT LABORATORY DATA AND EVALUATIONS: Admitting glucose 179, BUN 16, creatinine 1.09, sodium 133, potassium 4.2, chloride 100, CO2 of 23, calcium 9.0. LFTs showed total protein 8.5, albumin 3.6, bilirubin total 0.8. WBC 14, hemoglobin 14.8, platelet count was 225,000. INR 1.2. Urine cultures: No growth. UA: Nitrites negative, bacteria 1+. CT scan per PE protocol showed no evidence of PE. CT of the head showed no acute intracranial pathology. Cerebral atrophy. Lower extremity Dopplers showed positive right mid to distal femoral acute DVT.   HOSPITAL COURSE: Please refer to H and P done by the admitting physician. The patient is a 81 year old elderly male with history of hypertension, borderline diabetes, chronic hyponatremia, history of prostate cancer, hyperlipidemia, history of recurrent UTIs, dementia,  seizures, who was brought in because of difficulty with ambulation and weakness. The patient was noted to have an abnormal UA, as well as a DVT; therefore, he was admitted to the hospital. He was seen by physical therapy and recommended to go to rehab. In terms of his urinary tract infection, his culture did not grow anything, but he did respond to antibiotics with his white blood cell count normalizing. The patient also was noted to have a DVT was started on Xarelto. At this time,  he is stable to go to rehab.   DISCHARGE MEDICATIONS: Finasteride 5 mg daily, aspirin 81 mg 1 tablet p.o. daily, isosorbide mononitrate 60 mg daily, Namenda XR 20 mg daily, Lasix 20 mg daily, Colace 100 mg 1 tablet p.o. b.i.d., Flomax 0.4 mg daily, Cipro 500 mg 1 tablet p.o. q. 12 hours x5 days, acetaminophen 650 hours q. 4 hours p.r.n. for pain, Xarelto 15 mg 1 tablet p.o. b.i.d. x21 days, then Xarelto 20 mg daily.   DIET: Low sodium, low carbohydrate.   ACTIVITY: As tolerated with assistance. PT evaluation and treatment.   FOLLOWUP: With primary MD in 1 to 2 weeks. The patient to resume home hospice service if able at the facility.    TIME SPENT: 35 minutes.   ____________________________ Lafonda Mosses Posey Pronto, MD shp:ts D: 05/28/2014 12:15:00 ET T: 05/28/2014 12:42:57 ET JOB#: [id number]  cc: Ermelinda Eckert H. Posey Pronto, MD, <Dictator> Alric Seton MD ELECTRONICALLY SIGNED 05/29/2014 15:56

## 2014-08-09 DEATH — deceased

## 2015-09-13 IMAGING — CT CT ANGIO CHEST
2 of 6 series · 19 of 46 positions shown · IV contrast (APPLIED)
Comparison: Chest x-ray from earlier in the same day.

CLINICAL DATA: Known history of right leg deep venous thrombosis,
weakness

EXAM:
CT ANGIOGRAPHY CHEST WITH CONTRAST
TECHNIQUE: Multidetector CT imaging of the chest was performed using the
standard protocol during bolus administration of intravenous
contrast. Multiplanar CT image reconstructions and MIPs were
obtained to evaluate the vascular anatomy.
CONTRAST:  100 mL Omnipaque 350.

[Series 5: pe thins 1.5 · axial · 0.68mm/px · z∈[-237,+5]mm · 16 of 225 slices shown]
[im 12/225  lung]
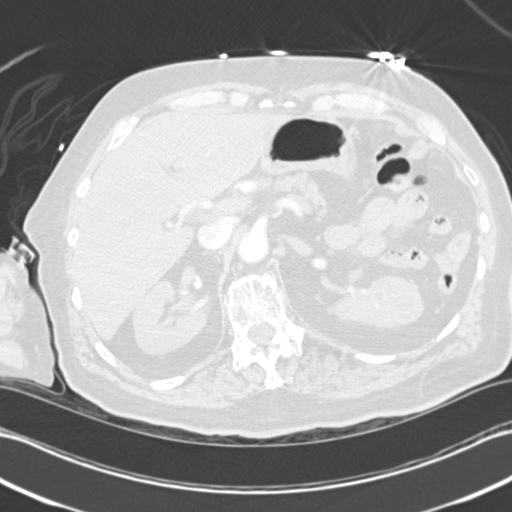
[im 24/225  soft-tissue]
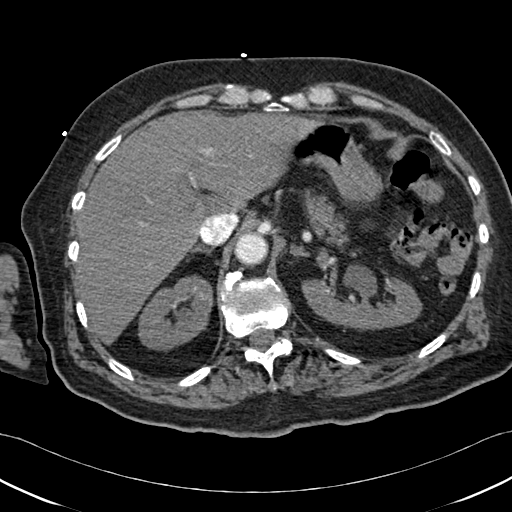
[im 36/225  lung]
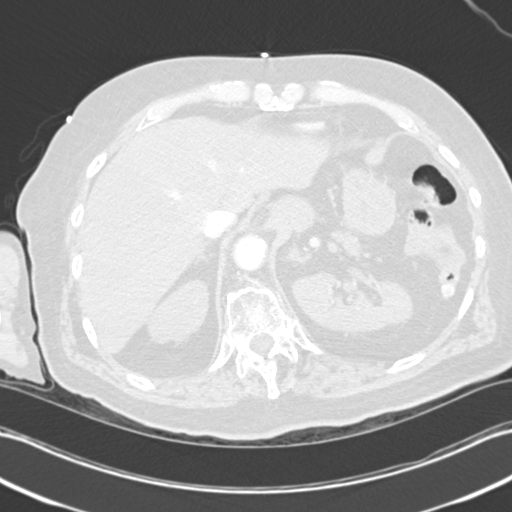
[im 48/225  soft-tissue]
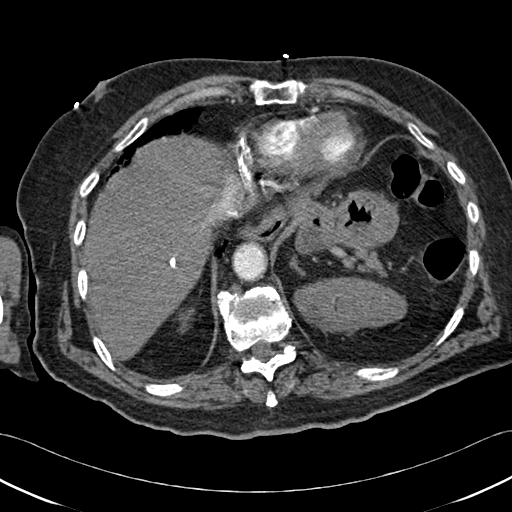
[im 71/225  lung]
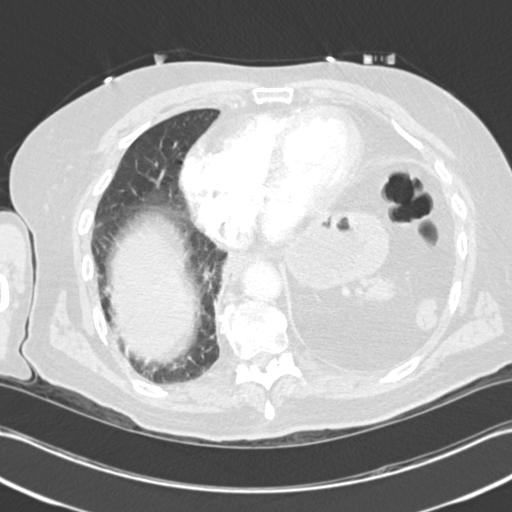
[im 83/225  soft-tissue]
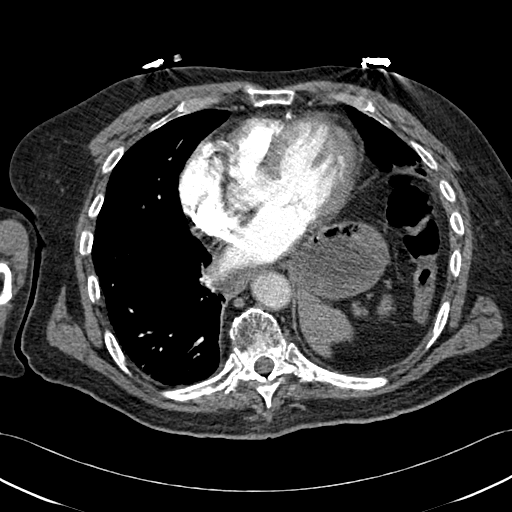
[im 95/225  lung]
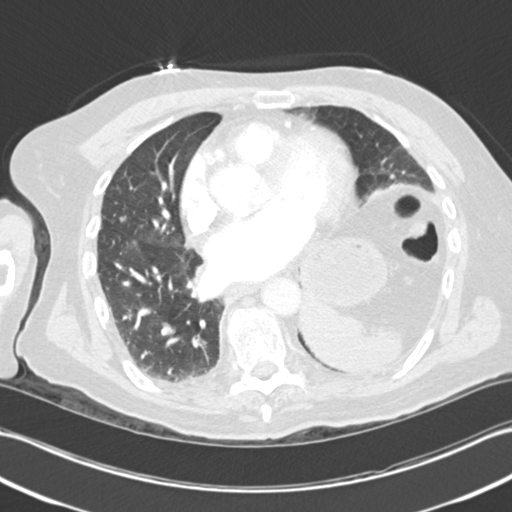
[im 107/225  soft-tissue]
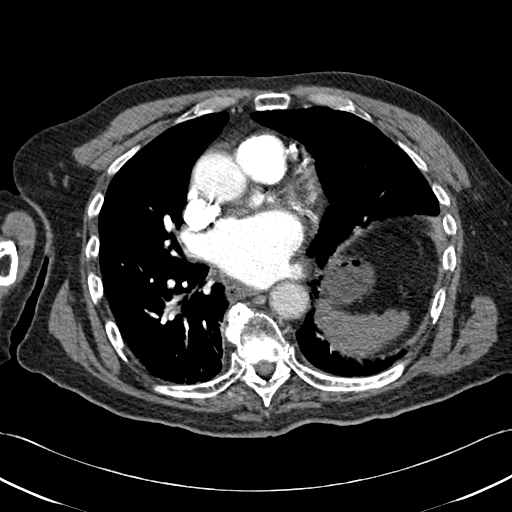
[im 118/225  lung]
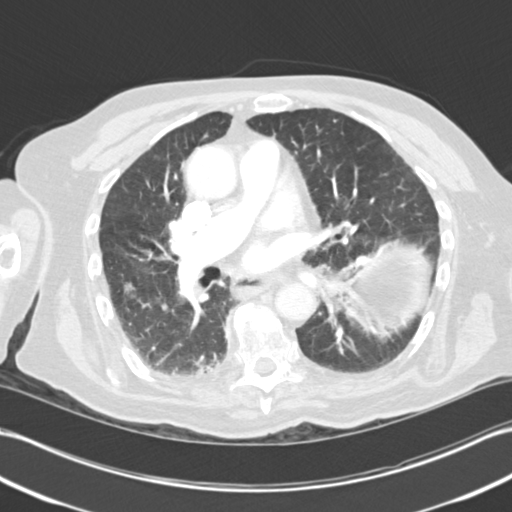
[im 130/225  soft-tissue]
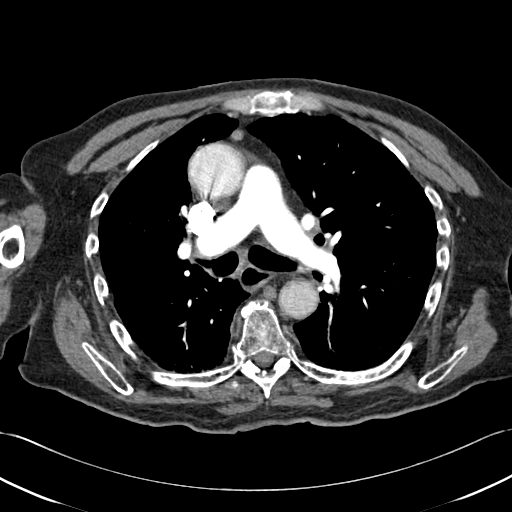
[im 142/225  lung]
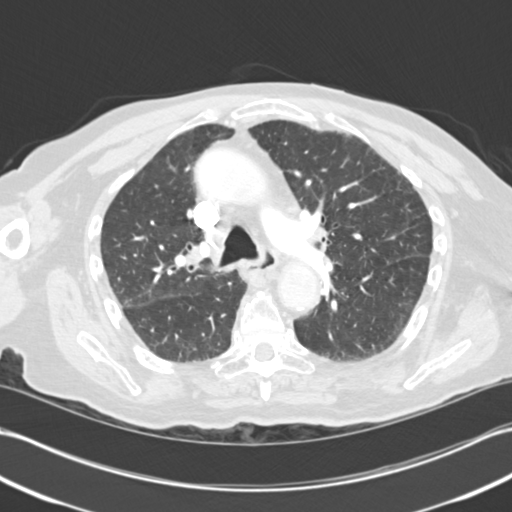
[im 154/225  soft-tissue]
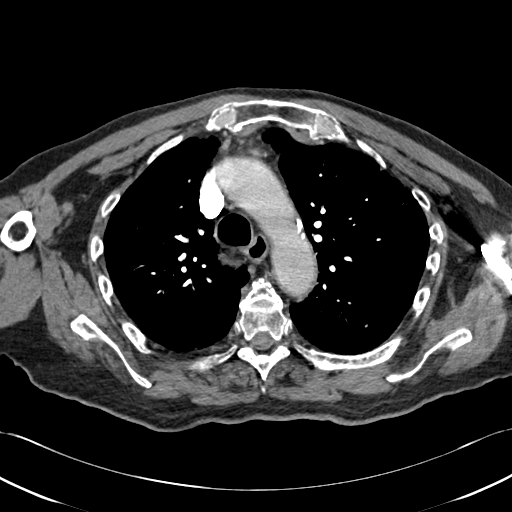
[im 177/225  lung]
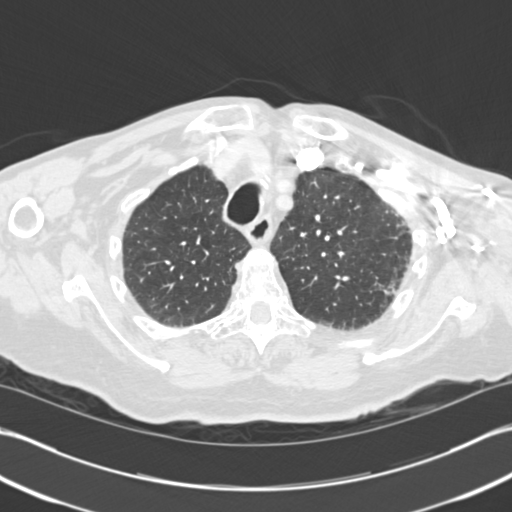
[im 189/225  soft-tissue]
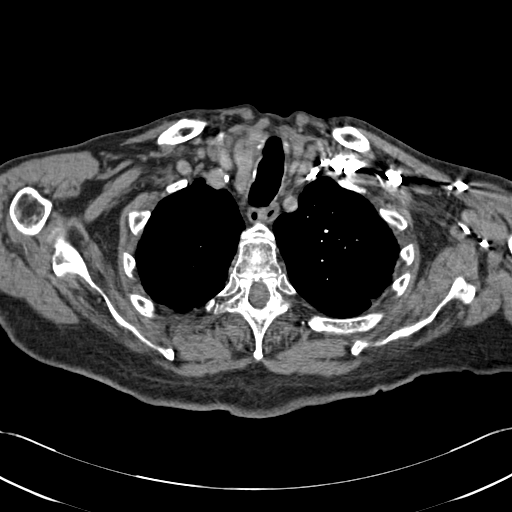
[im 201/225  lung]
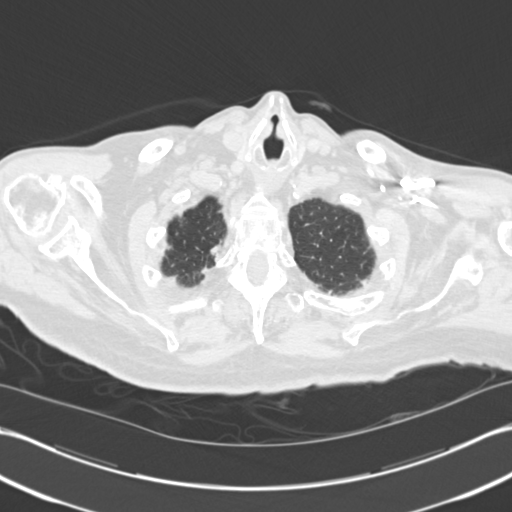
[im 213/225  soft-tissue]
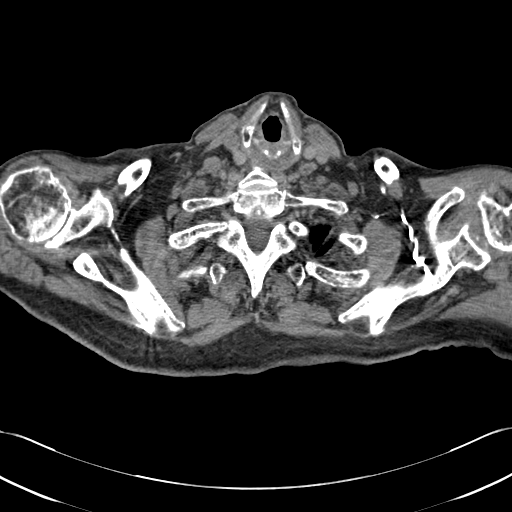

[Series 7: cor mpr 2.0 · coronal · 0.54mm/px · 3 of 117 slices shown]
[im 30/117  soft-tissue]
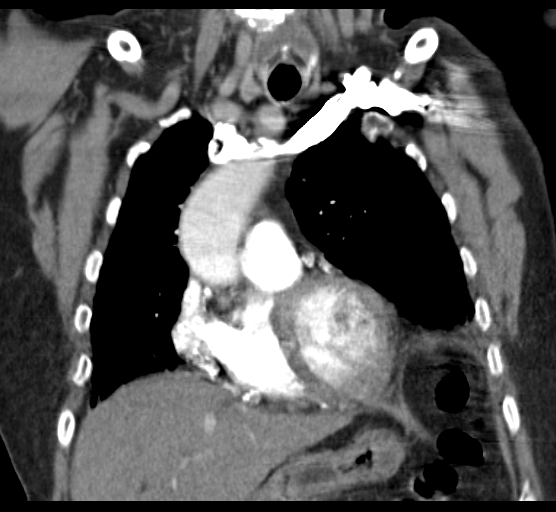
[im 59/117  soft-tissue]
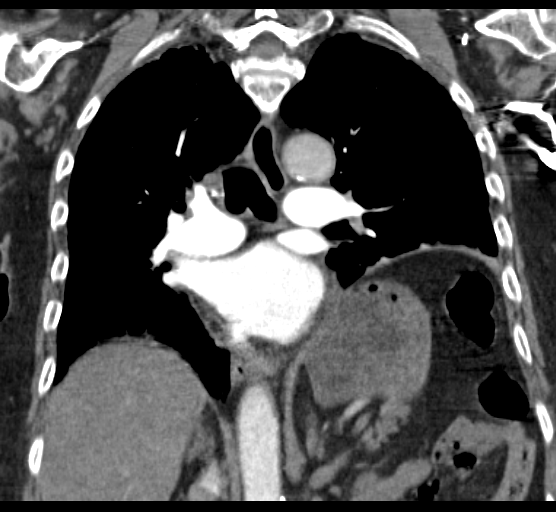
[im 88/117  soft-tissue]
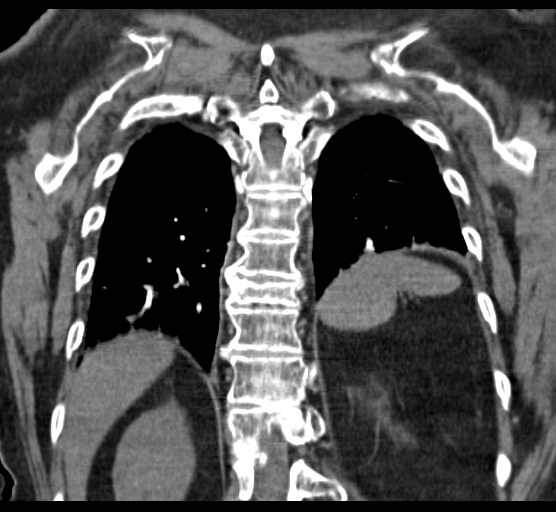

[19 of 46 positions shown; findings below may reference images not displayed]

FINDINGS: The lungs are well aerated bilaterally. Minimal dependent
atelectatic changes are seen. No focal confluent infiltrate is
noted. No sizable parenchymal nodule or pleural effusion is seen.
The thoracic inlet is within normal limits. The thoracic aorta and
its branches show some mild atherosclerotic calcification although
no aneurysmal dilatation or dissection is seen. Heavy coronary
calcifications are noted. The pulmonary artery is well visualized
and demonstrates a normal branching pattern bilaterally. No filling
defects to suggest pulmonary artery emboli are seen. No findings to
suggest right heart strain are noted. No hilar or mediastinal
adenopathy is seen.

Scanning into the upper abdomen reveals diverticular change of the
colon. The osseous structures show degenerative change of the
thoracic spine. A compression deformity is noted at T9. This is
chronic in nature.

Review of the MIP images confirms the above findings.
IMPRESSION: Chronic T9 compression deformity.

No evidence of pulmonary emboli.

Mild dependent atelectatic changes.

No acute abnormality is noted.
